# Patient Record
Sex: Female | Born: 1970 | Race: Black or African American | Hispanic: No | Marital: Single | Smoking: Current some day smoker
Health system: Southern US, Community
[De-identification: ages and names within clinical notes are randomized; demographics above are authoritative.]

## PROBLEM LIST (undated history)

## (undated) DIAGNOSIS — E119 Type 2 diabetes mellitus without complications: Secondary | ICD-10-CM

## (undated) DIAGNOSIS — T7840XA Allergy, unspecified, initial encounter: Secondary | ICD-10-CM

## (undated) DIAGNOSIS — I10 Essential (primary) hypertension: Secondary | ICD-10-CM

## (undated) DIAGNOSIS — B019 Varicella without complication: Secondary | ICD-10-CM

## (undated) HISTORY — DX: Allergy, unspecified, initial encounter: T78.40XA

## (undated) HISTORY — PX: TUBAL LIGATION: SHX77

## (undated) HISTORY — DX: Type 2 diabetes mellitus without complications: E11.9

## (undated) HISTORY — DX: Essential (primary) hypertension: I10

## (undated) HISTORY — DX: Varicella without complication: B01.9

---

## 2017-01-11 ENCOUNTER — Encounter: Payer: Self-pay | Admitting: *Deleted

## 2017-01-11 ENCOUNTER — Ambulatory Visit
Admission: EM | Admit: 2017-01-11 | Discharge: 2017-01-11 | Disposition: A | Discharge status: Home / Self Care | Payer: Self-pay | Attending: Family Medicine | Admitting: Family Medicine

## 2017-01-11 DIAGNOSIS — K122 Cellulitis and abscess of mouth: Secondary | ICD-10-CM

## 2017-01-11 DIAGNOSIS — K1370 Unspecified lesions of oral mucosa: Secondary | ICD-10-CM

## 2017-01-11 MED ORDER — AMOXICILLIN-POT CLAVULANATE 875-125 MG PO TABS: 1.0000 | ORAL_TABLET | Freq: Two times a day (BID) | ORAL | 0 refills | Status: DC

## 2017-01-11 MED ORDER — MELOXICAM 15 MG PO TABS: 15.0000 mg | ORAL_TABLET | Freq: Every day | ORAL | 0 refills | Status: DC

## 2017-01-11 NOTE — ED Provider Notes (Signed)
[facility]    CSN: [id number] Arrival date & time: 01/11/17  1231     History   Chief Complaint Chief Complaint  Patient presents with  . Oral Swelling    HPI Stacy Curry is a 46 y.o. female.   Patient is a 46 year old [place] female who woke this morning with swelling of the right upper mouth underneath the right nostril and right eye. She hasn't an appointment for next week she's had trouble with her teeth and oral mucosa. She smokes she's not allergic to medication she's had a tubal ligation but no other surgeries no other medical problems no pertinent family medical history relevant to today's visit.   The history is provided by the patient. No language interpreter was used.    History reviewed. No pertinent past medical history.  There are no active problems to display for this patient.   Past Surgical History:  Procedure Laterality Date  . TUBAL LIGATION      OB History    No data available       Home Medications    Prior to Admission medications   Medication Sig Start Date End Date Taking? Authorizing Provider  amoxicillin-clavulanate (AUGMENTIN) 875-125 MG tablet Take 1 tablet by mouth 2 (two) times daily. 01/11/17   Hassan Rowan, MD  meloxicam (MOBIC) 15 MG tablet Take 1 tablet (15 mg total) by mouth daily. 01/11/17   Hassan Rowan, MD    Family History History reviewed. No pertinent family history.  Social History Social History  Substance Use Topics  . Smoking status: Current Every Day Smoker  . Smokeless tobacco: Never Used  . Alcohol use No     Allergies   Patient has no known allergies.   Review of Systems Review of Systems  HENT: Positive for facial swelling and sinus pain. Negative for sore throat.   All other systems reviewed and are negative.    Physical Exam Triage Vital Signs ED Triage Vitals  Enc Vitals Group     BP 01/11/17 1247 (!) 144/86     Pulse Rate 01/11/17 1247 78     Resp 01/11/17 1247 16       Temp 01/11/17 1247 98.6 F (37 C)     Temp Source 01/11/17 1247 Oral     SpO2 01/11/17 1247 100 %     Weight 01/11/17 1249 168 lb (76.2 kg)     Height 01/11/17 1249 5\' 4"  (1.626 m)     Head Circumference --      Peak Flow --      Pain Score 01/11/17 1249 10     Pain Loc --      Pain Edu? --      Excl. in GC? --    No data found.   Updated Vital Signs BP (!) 144/86 (BP Location: Left Arm)   Pulse 78   Temp 98.6 F (37 C) (Oral)   Resp 16   Ht 5\' 4"  (1.626 m)   Wt 168 lb (76.2 kg)   LMP 01/11/2017   SpO2 100%   BMI 28.84 kg/m   Visual Acuity Right Eye Distance:   Left Eye Distance:   Bilateral Distance:    Right Eye Near:   Left Eye Near:    Bilateral Near:     Physical Exam  Constitutional: She is oriented to person, place, and time. She appears well-developed and well-nourished.  HENT:  Head: Normocephalic and atraumatic.  Right Ear: External ear normal. A foreign body  is present.  Left Ear: Hearing, tympanic membrane, external ear and ear canal normal.  Nose: Mucosal edema present. Right sinus exhibits maxillary sinus tenderness. Right sinus exhibits no frontal sinus tenderness. Left sinus exhibits no maxillary sinus tenderness and no frontal sinus tenderness.    Mouth/Throat: Oropharynx is clear and moist.  Swelling in the right upper mouth and cheek consistent with or abscess.  Eyes: Pupils are equal, round, and reactive to light. EOM are normal.  Neck: Normal range of motion. Neck supple. No tracheal deviation present.  Pulmonary/Chest: Effort normal.  Musculoskeletal: Normal range of motion.  Neurological: She is alert and oriented to person, place, and time.  Skin: Skin is warm.  Psychiatric: She has a normal mood and affect.  Vitals reviewed.    UC Treatments / Results  Labs (all labs ordered are listed, but only abnormal results are displayed) Labs Reviewed - No data to display  EKG  EKG Interpretation None       Radiology No  results found.  Procedures Procedures (including critical care time)  Medications Ordered in UC Medications - No data to display   Initial Impression / Assessment and Plan / UC Course  I have reviewed the triage vital signs and the nursing notes.  Pertinent labs & imaging results that were available during my care of the patient were reviewed by me and considered in my medical decision making (see chart for details).   offer shot of Toradol she declined offer narcotics she also declined we'll place on Mobic 15 mg 1 tablet day Augmentin 875 one tablet twice a day work note given for today and tomorrow follow-up with Maurine Ministerennis next week as scheduled.  Final Clinical Impressions(s) / UC Diagnoses   Final diagnoses:  Oral soft tissue disease  Cellulitis and abscess of oral soft tissues    New Prescriptions Discharge Medication List as of 01/11/2017  2:19 PM    START taking these medications   Details  amoxicillin-clavulanate (AUGMENTIN) 875-125 MG tablet Take 1 tablet by mouth 2 (two) times daily., Starting Wed 01/11/2017, Normal    meloxicam (MOBIC) 15 MG tablet Take 1 tablet (15 mg total) by mouth daily., Starting Wed 01/11/2017, Normal       Note: This dictation was prepared with Dragon dictation along with smaller phrase technology. Any transcriptional errors that result from this process are unintentional.  Controlled Substance Prescriptions [place] Controlled Substance Registry consulted? Not Applicable   Hassan RowanWade, Saw Mend[place], MD 01/11/17 [phone]

## 2017-01-11 NOTE — ED Triage Notes (Signed)
PAtient started having symptom of right side facial swelling and pain. Patient does report that she has an appointment with her dentist for extensive dental repair.

## 2017-08-07 ENCOUNTER — Other Ambulatory Visit: Payer: Self-pay

## 2017-08-07 ENCOUNTER — Telehealth: Payer: Self-pay | Admitting: Family Medicine

## 2017-08-07 ENCOUNTER — Ambulatory Visit
Admission: EM | Admit: 2017-08-07 | Discharge: 2017-08-07 | Disposition: A | Discharge status: Home / Self Care | Payer: Self-pay | Attending: Family Medicine | Admitting: Family Medicine

## 2017-08-07 ENCOUNTER — Encounter: Payer: Self-pay | Admitting: Emergency Medicine

## 2017-08-07 DIAGNOSIS — R059 Cough, unspecified: Secondary | ICD-10-CM

## 2017-08-07 DIAGNOSIS — R05 Cough: Secondary | ICD-10-CM

## 2017-08-07 MED ORDER — HYDROCOD POLST-CPM POLST ER 10-8 MG/5ML PO SUER: 5.0000 mL | Freq: Two times a day (BID) | ORAL | 0 refills | Status: DC | PRN

## 2017-08-07 NOTE — ED Triage Notes (Signed)
Patient c/o cough and chest congestion that started last night.  Patient denies fevers.

## 2017-08-07 NOTE — Telephone Encounter (Signed)
Rx sent to different pharmacy.

## 2017-08-07 NOTE — Discharge Instructions (Signed)
Rest, fluids. ° °Cough medication as directed. ° °Take care ° °Dr. Amad Mau  °

## 2017-08-07 NOTE — ED Provider Notes (Signed)
[facility]  CSN: [id number] Arrival date & time: 08/07/17  1010  History   Chief Complaint Chief Complaint  Patient presents with  . Cough   HPI  47 year old female presents with cough.  Patient states that she has had severe cough as of last night.  Nonproductive.  Dry.  No fever.  No shortness of breath.  She states that she is now experience and headache from all the coughing.  She is taken Robitussin without improvement.  No known exacerbating factors.  She is a smoker.  No other associated symptoms.  No other complaints at this time.  Social History Social History   Tobacco Use  . Smoking status: Current Every Day Smoker  . Smokeless tobacco: Never Used  Substance Use Topics  . Alcohol use: No  . Drug use: No   Allergies   Patient has no known allergies.   Review of Systems Review of Systems  Respiratory: Positive for cough.    Physical Exam Triage Vital Signs ED Triage Vitals  Enc Vitals Group     BP 08/07/17 1058 140/90     Pulse Rate 08/07/17 1058 86     Resp 08/07/17 1058 16     Temp 08/07/17 1058 98.1 F (36.7 C)     Temp Source 08/07/17 1058 Oral     SpO2 08/07/17 1058 100 %     Weight 08/07/17 1055 172 lb (78 kg)     Height 08/07/17 1055 5\' 4"  (1.626 m)     Head Circumference --      Peak Flow --      Pain Score 08/07/17 1055 0     Pain Loc --      Pain Edu? --      Excl. in GC? --    Updated Vital Signs BP 140/90 (BP Location: Right Arm)   Pulse 86   Temp 98.1 F (36.7 C) (Oral)   Resp 16   Ht 5\' 4"  (1.626 m)   Wt 172 lb (78 kg)   LMP 07/17/2017 (Approximate)   SpO2 100%   BMI 29.52 kg/m   Physical Exam  Constitutional: She is oriented to person, place, and time. She appears well-developed. No distress.  HENT:  Head: Normocephalic and atraumatic.  Mouth/Throat: Oropharynx is clear and moist.  Cardiovascular: Normal rate and regular rhythm.  Pulmonary/Chest: Effort normal and breath sounds normal. She has no wheezes.  She has no rales.  Neurological: She is alert and oriented to person, place, and time.  Psychiatric: She has a normal mood and affect. Her behavior is normal.  Nursing note and vitals reviewed.  UC Treatments / Results  Labs (all labs ordered are listed, but only abnormal results are displayed) Labs Reviewed - No data to display  EKG None Radiology No results found.  Procedures Procedures (including critical care time)  Medications Ordered in UC Medications - No data to display   Initial Impression / Assessment and Plan / UC Course  I have reviewed the triage vital signs and the nursing notes.  Pertinent labs & imaging results that were available during my care of the patient were reviewed by me and considered in my medical decision making (see chart for details).     47 year old female presents with cough.  Treating with Tussionex.  Final Clinical Impressions(s) / UC Diagnoses   Final diagnoses:  Cough    ED Discharge Orders        Ordered    chlorpheniramine-HYDROcodone (TUSSIONEX PENNKINETIC ER) 10-8  MG/5ML SUER  Every 12 hours PRN     08/07/17 1139     Controlled Substance Prescriptions [place] Controlled Substance Registry consulted? Not Applicable   Tommie Sams, DO 08/07/17 1155

## 2017-10-12 ENCOUNTER — Emergency Department
Admission: EM | Admit: 2017-10-12 | Discharge: 2017-10-12 | Disposition: A | Discharge status: Home / Self Care | Payer: Self-pay | Attending: Emergency Medicine | Admitting: Emergency Medicine

## 2017-10-12 ENCOUNTER — Encounter: Payer: Self-pay | Admitting: Emergency Medicine

## 2017-10-12 ENCOUNTER — Other Ambulatory Visit: Payer: Self-pay

## 2017-10-12 DIAGNOSIS — R030 Elevated blood-pressure reading, without diagnosis of hypertension: Secondary | ICD-10-CM | POA: Insufficient documentation

## 2017-10-12 DIAGNOSIS — F17200 Nicotine dependence, unspecified, uncomplicated: Secondary | ICD-10-CM | POA: Insufficient documentation

## 2017-10-12 DIAGNOSIS — J0100 Acute maxillary sinusitis, unspecified: Secondary | ICD-10-CM | POA: Insufficient documentation

## 2017-10-12 MED ORDER — FEXOFENADINE-PSEUDOEPHED ER 60-120 MG PO TB12: 1.0000 | ORAL_TABLET | Freq: Two times a day (BID) | ORAL | 0 refills | Status: DC

## 2017-10-12 MED ORDER — AMOXICILLIN 500 MG PO CAPS: 500.0000 mg | ORAL_CAPSULE | Freq: Three times a day (TID) | ORAL | 0 refills | Status: DC

## 2017-10-12 MED ORDER — NAPROXEN 500 MG PO TABS: 500.0000 mg | ORAL_TABLET | Freq: Two times a day (BID) | ORAL | Status: DC

## 2017-10-12 NOTE — ED Triage Notes (Addendum)
Presents with a 1 week hx of sinus pressure and headache   Pt has swelling to right side of face on arrival

## 2017-10-12 NOTE — ED Provider Notes (Addendum)
[facility] Emergency Department Provider Note   ____________________________________________   First MD Initiated Contact with Patient 10/12/17 1157     (approximate)  I have reviewed the triage vital signs and the nursing notes.   HISTORY  Chief Complaint Facial Pain    HPI Stacy Curry is a 47 y.o. female patient presents for 1 week of frontal headache and sinus pressure.  Patient state pain also radiates to upper teeth.  Patient denies fever or chills associated with this complaint.  It was noted that patient blood pressure elevated but no history of hypertension.  Patient denies vision change or vertigo.  Patient denies fever/chills associated with complaint.  Patient denies dental issues.  History reviewed. No pertinent past medical history.  There are no active problems to display for this patient.   Past Surgical History:  Procedure Laterality Date  . TUBAL LIGATION      Prior to Admission medications   Medication Sig Start Date End Date Taking? Authorizing Provider  amoxicillin (AMOXIL) 500 MG capsule Take 1 capsule (500 mg total) by mouth 3 (three) times daily. 10/12/17   Joni Reining, PA-C  chlorpheniramine-HYDROcodone (TUSSIONEX PENNKINETIC ER) 10-8 MG/5ML SUER Take 5 mLs by mouth every 12 (twelve) hours as needed. 08/07/17   Tommie Sams, DO  chlorpheniramine-HYDROcodone (TUSSIONEX PENNKINETIC ER) 10-8 MG/5ML SUER Take 5 mLs by mouth every 12 (twelve) hours as needed. 08/07/17   Tommie Sams, DO  fexofenadine-pseudoephedrine (ALLEGRA-D) 60-120 MG 12 hr tablet Take 1 tablet by mouth 2 (two) times daily. 10/12/17   Joni Reining, PA-C  naproxen (NAPROSYN) 500 MG tablet Take 1 tablet (500 mg total) by mouth 2 (two) times daily with a meal. 10/12/17   Joni Reining, PA-C    Allergies Patient has no known allergies.  No family history on file.  Social History Social History   Tobacco Use  . Smoking status: Current Every Day  Smoker  . Smokeless tobacco: Never Used  Substance Use Topics  . Alcohol use: No  . Drug use: No    Review of Systems Constitutional: No fever/chills Eyes: No visual changes. ENT: Sinus congestion and facial pain.  Cardiovascular: Denies chest pain. Respiratory: Denies shortness of breath. Gastrointestinal: No abdominal pain.  No nausea, no vomiting.  No diarrhea.  No constipation. Genitourinary: Negative for dysuria. Musculoskeletal: Negative for back pain. Skin: Negative for rash. Neurological: Positive for frontal headaches, but denies focal weakness or numbness.   ____________________________________________   PHYSICAL EXAM:  VITAL SIGNS: ED Triage Vitals [10/12/17 1154]  Enc Vitals Group     BP      Pulse      Resp      Temp      Temp src      SpO2      Weight 172 lb (78 kg)     Height  (1.626 m)     Head Circumference      Peak Flow      Pain Score 4     Pain Loc      Pain Edu?      Excl. in GC?    Constitutional: Alert and oriented. Well appearing and in no acute distress. Eyes: Conjunctivae are normal. PERRL. EOMI. Head: Atraumatic. Nose: Bilateral edematous nasal turbinates with thick rhinorrhea.  Bilateral maxillary sinus guarding. Mouth/Throat: Mucous membranes are moist.  Oropharynx non-erythematous. Neck: No stridor.  Hematological/Lymphatic/Immunilogical: No cervical lymphadenopathy. Cardiovascular: Normal rate, regular rhythm. Grossly normal heart sounds.  Good peripheral circulation.  Elevated blood pressure. Respiratory: Normal respiratory effort.  No retractions. Lungs CTAB. Neurologic:  Normal speech and language. No gross focal neurologic deficits are appreciated. No gait instability. Skin:  Skin is warm, dry and intact. No rash noted. Psychiatric: Mood and affect are normal. Speech and behavior are normal.  ____________________________________________   LABS (all labs ordered are listed, but only abnormal results are  displayed)  Labs Reviewed - No data to display ____________________________________________  EKG   ____________________________________________  RADIOLOGY  ED MD interpretation:    Official radiology report(s): No results found.  ____________________________________________   PROCEDURES  Procedure(s) performed: None  Procedures  Critical Care performed: No  ____________________________________________   INITIAL IMPRESSION / ASSESSMENT AND PLAN / ED COURSE  As part of my medical decision making, I reviewed the following data within the electronic MEDICAL RECORD NUMBER    Frontal headache and sinus pressure secondary sinusitis.  Patient blood pressure elevated and advised to have it followed up by PCP.  Take medication as directed.  Patient given a work note for today.      ____________________________________________   FINAL CLINICAL IMPRESSION(S) / ED DIAGNOSES  Final diagnoses:  Subacute maxillary sinusitis     ED Discharge Orders        Ordered    amoxicillin (AMOXIL) 500 MG capsule  3 times daily     10/12/17 1204    fexofenadine-pseudoephedrine (ALLEGRA-D) 60-120 MG 12 hr tablet  2 times daily     10/12/17 1204    naproxen (NAPROSYN) 500 MG tablet  2 times daily with meals     10/12/17 1204       Note:  This document was prepared using Dragon voice recognition software and may include unintentional dictation errors.    Joni Reining, PA-C 10/12/17 1215    Joni Reining, PA-C 10/12/17 1217    Sharman Cheek, MD 10/12/17 [phone]

## 2017-10-12 NOTE — Discharge Instructions (Signed)
Advised to follow-up with PCP for 3 days of blood pressure readings.

## 2017-11-14 ENCOUNTER — Emergency Department: Payer: Self-pay

## 2017-11-14 ENCOUNTER — Other Ambulatory Visit: Payer: Self-pay

## 2017-11-14 ENCOUNTER — Emergency Department
Admission: EM | Admit: 2017-11-14 | Discharge: 2017-11-14 | Disposition: A | Discharge status: Home / Self Care | Payer: Self-pay | Attending: Student in an Organized Health Care Education/Training Program | Admitting: Student in an Organized Health Care Education/Training Program

## 2017-11-14 ENCOUNTER — Encounter: Payer: Self-pay | Admitting: Emergency Medicine

## 2017-11-14 DIAGNOSIS — R079 Chest pain, unspecified: Secondary | ICD-10-CM | POA: Insufficient documentation

## 2017-11-14 DIAGNOSIS — F17200 Nicotine dependence, unspecified, uncomplicated: Secondary | ICD-10-CM | POA: Insufficient documentation

## 2017-11-14 DIAGNOSIS — R0789 Other chest pain: Secondary | ICD-10-CM | POA: Insufficient documentation

## 2017-11-14 LAB — CBC
HEMATOCRIT: 40.6 % (ref 35.0–47.0)
Hemoglobin: 14.5 g/dL (ref 12.0–16.0)
MCH: 32.5 pg (ref 26.0–34.0)
MCHC: 35.7 g/dL (ref 32.0–36.0)
MCV: 91.2 fL (ref 80.0–100.0)
PLATELETS: 283 10*3/uL (ref 150–440)
RBC: 4.46 MIL/uL (ref 3.80–5.20)
RDW: 14.2 % (ref 11.5–14.5)
WBC: 8.8 10*3/uL (ref 3.6–11.0)

## 2017-11-14 LAB — BASIC METABOLIC PANEL
Anion gap: 5 (ref 5–15)
BUN: 11 mg/dL (ref 6–20)
CHLORIDE: 107 mmol/L (ref 98–111)
CO2: 25 mmol/L (ref 22–32)
CREATININE: 0.69 mg/dL (ref 0.44–1.00)
Calcium: 9.4 mg/dL (ref 8.9–10.3)
GFR calc Af Amer: >60 mL/min (ref >60)
GLUCOSE: 130 mg/dL — AB (ref 70–99)
POTASSIUM: 4.2 mmol/L (ref 3.5–5.1)
Sodium: 137 mmol/L (ref 135–145)

## 2017-11-14 LAB — TROPONIN I
Troponin I: <0.03 ng/mL (ref <0.03)
Troponin I: <0.03 ng/mL (ref <0.03)

## 2017-11-14 LAB — FIBRIN DERIVATIVES D-DIMER (ARMC ONLY): Fibrin derivatives D-dimer (ARMC): 335.15 ng/mL (FEU) (ref 0.00–499.00)

## 2017-11-14 LAB — POCT PREGNANCY, URINE: Preg Test, Ur: NEGATIVE

## 2017-11-14 MED ORDER — TRAMADOL HCL 50 MG PO TABS: 50.0000 mg | ORAL_TABLET | Freq: Four times a day (QID) | ORAL | 0 refills | Status: AC | PRN

## 2017-11-14 MED ORDER — CYCLOBENZAPRINE HCL 5 MG PO TABS: 5.0000 mg | ORAL_TABLET | Freq: Three times a day (TID) | ORAL | 0 refills | Status: DC | PRN

## 2017-11-14 MED ORDER — HYDROCODONE-ACETAMINOPHEN 5-325 MG PO TABS
1.0000 | ORAL_TABLET | Freq: Once | ORAL | Status: AC
  Administered 2017-11-14: 1 via ORAL
  Filled 2017-11-14: qty 1

## 2017-11-14 NOTE — ED Triage Notes (Addendum)
Pt c/o mid back pain that started with sudden onset at approx 1530 today. Pt states when she was going to sit down pain started, pt states pain worse with movement. Pt states pain just off to the L side of her spine with a downward radiation at times. Pt now also stating pain under L breast, that started at 1530. Pt states pain is squeezing in nature. States pian radiates to her back.

## 2017-11-14 NOTE — ED Provider Notes (Signed)
[facility] Emergency Department Provider Note    First MD Initiated Contact with Patient 11/14/17 1724     (approximate)  I have reviewed the triage vital signs and the nursing notes.   HISTORY  Chief Complaint Back Pain and Chest Pain    HPI Stacy Curry is a 47 y.o. female since the ER with chief complaint of mild to moderate left-sided chest pain that does hurt worse when taking deep inspiration.  Does state the pain is worse with movement.  Denies any trauma and states that occurred while she was going to sit down.  Pain wraps around all the way on her left breast and describes it as a squeezing pain.  Does not report any tearing or ripping pain through to her back.  Is not on anticoagulation.  No recent surgeries.  Denies any history of cardiac disease but does smoke.  No fevers and no cough.    History reviewed. No pertinent past medical history. History reviewed. No pertinent family history. Past Surgical History:  Procedure Laterality Date  . TUBAL LIGATION     There are no active problems to display for this patient.     Prior to Admission medications   Medication Sig Start Date End Date Taking? Authorizing Provider  amoxicillin (AMOXIL) 500 MG capsule Take 1 capsule (500 mg total) by mouth 3 (three) times daily. 10/12/17   Joni Reining, PA-C  chlorpheniramine-HYDROcodone (TUSSIONEX PENNKINETIC ER) 10-8 MG/5ML SUER Take 5 mLs by mouth every 12 (twelve) hours as needed. 08/07/17   Tommie Sams, DO  chlorpheniramine-HYDROcodone (TUSSIONEX PENNKINETIC ER) 10-8 MG/5ML SUER Take 5 mLs by mouth every 12 (twelve) hours as needed. 08/07/17   Tommie Sams, DO  cyclobenzaprine (FLEXERIL) 5 MG tablet Take 1 tablet (5 mg total) by mouth 3 (three) times daily as needed for muscle spasms. 11/14/17   Willy Eddy, MD  fexofenadine-pseudoephedrine (ALLEGRA-D) 60-120 MG 12 hr tablet Take 1 tablet by mouth 2 (two) times daily. 10/12/17   Joni Reining,  PA-C  naproxen (NAPROSYN) 500 MG tablet Take 1 tablet (500 mg total) by mouth 2 (two) times daily with a meal. 10/12/17   Joni Reining, PA-C  traMADol (ULTRAM) 50 MG tablet Take 1 tablet (50 mg total) by mouth every 6 (six) hours as needed. 11/14/17 11/14/18  Willy Eddy, MD    Allergies Patient has no known allergies.    Social History Social History   Tobacco Use  . Smoking status: Current Every Day Smoker  . Smokeless tobacco: Never Used  Substance Use Topics  . Alcohol use: No  . Drug use: No    Review of Systems Patient denies headaches, rhinorrhea, blurry vision, numbness, shortness of breath, chest pain, edema, cough, abdominal pain, nausea, vomiting, diarrhea, dysuria, fevers, rashes or hallucinations unless otherwise stated above in HPI. ____________________________________________   PHYSICAL EXAM:  VITAL SIGNS: Vitals:   11/14/17 1650  BP: 113/75  Pulse: (!) 101  Resp: 20  Temp: 98.7 F (37.1 C)  SpO2: 99%    Constitutional: Alert and oriented.  Eyes: Conjunctivae are normal.  Head: Atraumatic. Nose: No congestion/rhinnorhea. Mouth/Throat: Mucous membranes are moist.   Neck: No stridor. Painless ROM.  Cardiovascular: Normal rate, regular rhythm. Grossly normal heart sounds.  Good peripheral circulation. Respiratory: Normal respiratory effort.  No retractions. Lungs CTAB. Gastrointestinal: Soft and nontender. No distention. No abdominal bruits. No CVA tenderness. Genitourinary: deferred Musculoskeletal: No lower extremity tenderness nor edema.  No joint effusions. Neurologic:  Normal speech and language. No gross focal neurologic deficits are appreciated. No facial droop Skin:  Skin is warm, dry and intact. No rash noted. Psychiatric: Mood and affect are normal. Speech and behavior are normal.  ____________________________________________   LABS (all labs ordered are listed, but only abnormal results are displayed)  Results for orders placed or  performed during the hospital encounter of 11/14/17 (from the past 24 hour(s))  Basic metabolic panel     Status: Abnormal   Collection Time: 11/14/17  5:06 PM  Result Value Ref Range   Sodium 137 135 - 145 mmol/L   Potassium 4.2 3.5 - 5.1 mmol/L   Chloride 107 98 - 111 mmol/L   CO2 25 22 - 32 mmol/L   Glucose, Bld 130 (H) 70 - 99 mg/dL   BUN 11 6 - 20 mg/dL   Creatinine, Ser 2.950.69 0.44 - 1.00 mg/dL   Calcium 9.4 8.9 - 18.810.3 mg/dL   GFR calc non Af Amer >60 >60 mL/min   GFR calc Af Amer >60 >60 mL/min   Anion gap 5 5 - 15  CBC     Status: None   Collection Time: 11/14/17  5:06 PM  Result Value Ref Range   WBC 8.8 3.6 - 11.0 K/uL   RBC 4.46 3.80 - 5.20 MIL/uL   Hemoglobin 14.5 12.0 - 16.0 g/dL   HCT 41.640.6 60.635.0 - 30.147.0 %   MCV 91.2 80.0 - 100.0 fL   MCH 32.5 26.0 - 34.0 pg   MCHC 35.7 32.0 - 36.0 g/dL   RDW 60.114.2 09.311.5 - 23.514.5 %   Platelets 283 150 - 440 K/uL  Troponin I     Status: None   Collection Time: 11/14/17  5:06 PM  Result Value Ref Range   Troponin I <0.03 <0.03 ng/mL  Fibrin derivatives D-Dimer ([facility] only)     Status: None   Collection Time: 11/14/17  5:31 PM  Result Value Ref Range   Fibrin derivatives D-dimer ([facility]) 335.15 0.00 - 499.00 ng/mL (FEU)  Pregnancy, urine POC     Status: None   Collection Time: 11/14/17  6:21 PM  Result Value Ref Range   Preg Test, Ur NEGATIVE NEGATIVE  Troponin I     Status: None   Collection Time: 11/14/17  8:09 PM  Result Value Ref Range   Troponin I <0.03 <0.03 ng/mL   ____________________________________________  EKG My review and personal interpretation at Time: 17:02   Indication: chest pain  Rate: 95  Rhythm: sinus Axis: normal Other: normal intervals, nonspecific st abn, no stemi ____________________________________________  RADIOLOGY  I personally reviewed all radiographic images ordered to evaluate for the above acute complaints and reviewed radiology reports and findings.  These findings were personally discussed with  the patient.  Please see medical record for radiology report.  ____________________________________________   PROCEDURES  Procedure(s) performed:  Procedures    Critical Care performed: no ____________________________________________   INITIAL IMPRESSION / ASSESSMENT AND PLAN / ED COURSE  Pertinent labs & imaging results that were available during my care of the patient were reviewed by me and considered in my medical decision making (see chart for details).   DDX: ACS, pericarditis, esophagitis, boerhaaves, pe, dissection, pna, bronchitis, costochondritis   Loletta SpecterJenate [place] is a 47 y.o. who presents to the ED with symptoms as described above.  Patient nontoxic with normotensive and afebrile.  Her abdominal exam is soft and benign.  Denies any symptoms to suggest pyelonephritis or kidney stone.  EKG shows no  evidence of acute ischemia or dysrhythmia.  On examination seems most clinically consistent with muscular skeletal strain but based on her age smoking history and risk factors will further evaluate with serial enzymes.  Patient low risk by Wells criteria and therefore d-dimer was sent to further risk stratify for PE.  D-dimer negative.  Will provide oral pain medication and reassess.  Clinical Course as of Nov 14 2053  Tue Nov 14, 2017  1918 RDW: 14.2 [PR]  2045 Repeat troponin is negative.  Seems most clinically consistent with muscular skeletal pain.  Not clinically consistent with ACS.  Patient remains hemodynamically stable.   [PR]    Clinical Course User Index [PR] Willy Eddy, MD     As part of my medical decision making, I reviewed the following data within the electronic MEDICAL RECORD NUMBER Nursing notes reviewed and incorporated, Labs reviewed, notes from prior ED visits.   ____________________________________________   FINAL CLINICAL IMPRESSION(S) / ED DIAGNOSES  Final diagnoses:  Chest wall pain      NEW MEDICATIONS STARTED DURING THIS VISIT:  New  Prescriptions   CYCLOBENZAPRINE (FLEXERIL) 5 MG TABLET    Take 1 tablet (5 mg total) by mouth 3 (three) times daily as needed for muscle spasms.   TRAMADOL (ULTRAM) 50 MG TABLET    Take 1 tablet (50 mg total) by mouth every 6 (six) hours as needed.     Note:  This document was prepared using Dragon voice recognition software and may include unintentional dictation errors.    Willy Eddy, MD 11/14/17 2055

## 2018-12-16 ENCOUNTER — Other Ambulatory Visit: Payer: Self-pay

## 2018-12-16 ENCOUNTER — Encounter: Payer: Self-pay | Admitting: Emergency Medicine

## 2018-12-16 ENCOUNTER — Ambulatory Visit
Admission: EM | Admit: 2018-12-16 | Discharge: 2018-12-16 | Disposition: A | Discharge status: Home / Self Care | Payer: Self-pay | Attending: Emergency Medicine | Admitting: Emergency Medicine

## 2018-12-16 DIAGNOSIS — K047 Periapical abscess without sinus: Secondary | ICD-10-CM

## 2018-12-16 MED ORDER — HYDROCODONE-ACETAMINOPHEN 5-325 MG PO TABS: 1.0000 | ORAL_TABLET | Freq: Four times a day (QID) | ORAL | 0 refills | Status: DC | PRN

## 2018-12-16 MED ORDER — AMOXICILLIN-POT CLAVULANATE 875-125 MG PO TABS: 1.0000 | ORAL_TABLET | Freq: Two times a day (BID) | ORAL | 0 refills | Status: DC

## 2018-12-16 NOTE — Discharge Instructions (Addendum)
May use ice 15 to 20 minutes out of every 2 hours over the area of discomfort.  Use ibuprofen 400 mg combined with Tylenol 500 mg every 6 hours as necessary for pain.  Use hydrocodone on a as necessary basis for severe pain.  Be sure the total of the Tylenol that you use does not equal more than 4000 mg.  Follow-up with your dentist tomorrow at [facility].

## 2018-12-16 NOTE — ED Triage Notes (Signed)
Patient states she had a tooth pulled on Tuesday. Patient now c/o facial swelling and pain that started yesterday. Patient states she took 1 Tylenol with Codeine on Tuesday and had no reaction but she took again last night and woke up with the swelling in her face.

## 2018-12-16 NOTE — ED Provider Notes (Signed)
[facility]    CSN: [id number] Arrival date & time: 12/16/18  0850      History   Chief Complaint Chief Complaint  Patient presents with  . Facial Swelling  . Facial Pain    HPI Khira SwazilandJordan is a 48 y.o. female.   Presents with a chief complaint of left upper gum pain and swelling  HPI  48 year old female presents with left upper tooth pain over her canine.  She had a tooth removed on the right upper gum on Tuesday 5 days prior to this visit.  Is been using ibuprofen 600 mg combined with Tylenol 500 mg to help control her pain.  Last night she took a Tylenol with codeine prescribed by her dentist.  This was the second dose that she had taken throughout the entire.  From the extraction.  She has very poor dentition and is in the process of having all of the of her teeth extracted.  She awoke this morning with the terrible pain opposite where her tooth was pulled and mild puffiness of her face.  She has significant pain to touch over the left upper canine.  All the rest of her teeth are in extremely poor condition.  She has had no difficulty swallowing she denies any fever.  Her blood pressure is elevated today most likely from the pain as she does not have a history of hypertension.        History reviewed. No pertinent past medical history.  There are no active problems to display for this patient.   Past Surgical History:  Procedure Laterality Date  . TUBAL LIGATION      OB History   No obstetric history on file.      Home Medications    Prior to Admission medications   Medication Sig Start Date End Date Taking? Authorizing Provider  amoxicillin-clavulanate (AUGMENTIN) 875-125 MG tablet Take 1 tablet by mouth every 12 (twelve) hours. 12/16/18   Lutricia Feiloemer, Favor Kreh P, PA-C  HYDROcodone-acetaminophen (NORCO/VICODIN) 5-325 MG tablet Take 1-2 tablets by mouth every 6 (six) hours as needed. 12/16/18   Lutricia Feiloemer, Shakeitha Umbaugh P, PA-C    Family History History reviewed. No  pertinent family history.  Social History Social History   Tobacco Use  . Smoking status: Current Every Day Smoker  . Smokeless tobacco: Never Used  Substance Use Topics  . Alcohol use: No  . Drug use: No     Allergies   Patient has no known allergies.   Review of Systems Review of Systems  Constitutional: Positive for activity change and appetite change. Negative for chills, diaphoresis, fatigue and fever.  HENT: Positive for dental problem.   All other systems reviewed and are negative.    Physical Exam Triage Vital Signs ED Triage Vitals  Enc Vitals Group     BP 12/16/18 0909 (!) 163/101     Pulse Rate 12/16/18 0909 94     Resp 12/16/18 0909 18     Temp 12/16/18 0909 98.8 F (37.1 C)     Temp Source 12/16/18 0909 Oral     SpO2 12/16/18 0909 97 %     Weight 12/16/18 0907 172 lb (78 kg)     Height 12/16/18 0907 5\' 4"  (1.626 m)     Head Circumference --      Peak Flow --      Pain Score 12/16/18 0907 10     Pain Loc --      Pain Edu? --  Excl. in GC? --    No data found.  Updated Vital Signs BP (!) 163/101 (BP Location: Right Arm)   Pulse 94   Temp 98.8 F (37.1 C) (Oral)   Resp 18   Ht 5\' 4"  (1.626 m)   Wt 172 lb (78 kg)   SpO2 97%   BMI 29.52 kg/m   Visual Acuity Right Eye Distance:   Left Eye Distance:   Bilateral Distance:    Right Eye Near:   Left Eye Near:    Bilateral Near:     Physical Exam Vitals signs and nursing note reviewed.  Constitutional:      General: She is not in acute distress.    Appearance: Normal appearance. She is not ill-appearing.  HENT:     Head: Normocephalic and atraumatic.     Right Ear: Tympanic membrane, ear canal and external ear normal.     Left Ear: Tympanic membrane, ear canal and external ear normal.     Nose: Nose normal.     Mouth/Throat:     Mouth: Mucous membranes are moist.     Dentition: Abnormal dentition. Dental tenderness, gingival swelling and dental caries present.     Pharynx:  Oropharynx is clear. Uvula midline. No pharyngeal swelling, oropharyngeal exudate, posterior oropharyngeal erythema or uvula swelling.      Comments: Examination shows the dentition to be in extremely poor condition.  All her upper teeth are laden with caries and most are fractured to the gumline.  Her left upper canine is extremely tender to palpation and there is a mild fluctuance present.  No frank pus is appreciated. Eyes:     Extraocular Movements: Extraocular movements intact.     Pupils: Pupils are equal, round, and reactive to light.  Neck:     Musculoskeletal: Normal range of motion and neck supple. No neck rigidity or muscular tenderness.  Musculoskeletal: Normal range of motion.  Lymphadenopathy:     Cervical: No cervical adenopathy.  Skin:    General: Skin is warm and dry.  Neurological:     General: No focal deficit present.     Mental Status: She is alert and oriented to person, place, and time.  Psychiatric:        Mood and Affect: Mood normal.        Behavior: Behavior normal.        Thought Content: Thought content normal.        Judgment: Judgment normal.      UC Treatments / Results  Labs (all labs ordered are listed, but only abnormal results are displayed) Labs Reviewed - No data to display  EKG   Radiology No results found.  Procedures Procedures (including critical care time)  Medications Ordered in UC Medications - No data to display  Initial Impression / Assessment and Plan / UC Course  I have reviewed the triage vital signs and the nursing notes.  Pertinent labs & imaging results that were available during my care of the patient were reviewed by me and considered in my medical decision making (see chart for details).   48 year old female presents after having an extraction of a diseased tooth on the right upper now presents with pain swelling and tenderness of the left upper canine.  All of her teeth are in extremely poor repair.  She is in the  process of having all of her teeth extracted.  Today she has evidence of a dental infection from the poor repair of her teeth.  We  will place her on Augmentin for 7 days.  Encouraged her to use salt water gargles frequently.  I will also prescribe a small amount of Vicodin for pain.  I have however encouraged her to use Tylenol 500 mg and ibuprofen 400 mg as much as possible.  She will follow-up with her dentist tomorrow or Tuesday.   Final Clinical Impressions(s) / UC Diagnoses   Final diagnoses:  Dental infection     Discharge Instructions     May use ice 15 to 20 minutes out of every 2 hours over the area of discomfort.  Use ibuprofen 400 mg combined with Tylenol 500 mg every 6 hours as necessary for pain.  Use hydrocodone on a as necessary basis for severe pain.  Be sure the total of the Tylenol that you use does not equal more than 4000 mg.  [facility]   ED Prescriptions    Medication Sig Dispense Auth. Provider   amoxicillin-clavulanate (AUGMENTIN) 875-125 MG tablet Take 1 tablet by mouth every 12 (twelve) hours. 14 tablet Ovid Curdoemer, Arwyn Besaw P, PA-C   HYDROcodone-acetaminophen (NORCO/VICODIN) 5-325 MG tablet Take 1-2 tablets by mouth every 6 (six) hours as needed. 10 tablet Lutricia Feiloemer, Dajae Kizer P, PA-C     Controlled Substance Prescriptions [place] Controlled Substance Registry consulted? Not Applicable   Lutricia FeilRoemer, Donold Marotto P, PA-C 12/16/18 1725

## 2019-04-02 ENCOUNTER — Other Ambulatory Visit: Payer: Self-pay

## 2019-04-02 ENCOUNTER — Encounter: Payer: Self-pay | Admitting: Physical Therapy

## 2019-04-02 ENCOUNTER — Ambulatory Visit: Payer: PRIVATE HEALTH INSURANCE | Attending: Family Medicine | Admitting: Physical Therapy

## 2019-04-02 DIAGNOSIS — M25611 Stiffness of right shoulder, not elsewhere classified: Secondary | ICD-10-CM | POA: Diagnosis present

## 2019-04-02 DIAGNOSIS — M25511 Pain in right shoulder: Secondary | ICD-10-CM | POA: Diagnosis not present

## 2019-04-02 NOTE — Therapy (Signed)
[facility] PHYSICAL AND SPORTS MEDICINE [address] [place], [place], [address] Phone: [phone]   Fax:  [phone]  Physical Therapy Treatment  Patient Details  Name: Stacy Curry MRN: [id number] Date of Birth: 12/14/1970 No data recorded  Encounter Date: 04/02/2019  PT End of Session - 04/02/19 1421    Visit Number  1    Number of Visits  17    Date for PT Re-Evaluation  05/28/19    PT Start Time  0200    PT Stop Time  0300    PT Time Calculation (min)  60 min    Activity Tolerance  Patient tolerated treatment well    Behavior During Therapy  [facility] for tasks assessed/performed       History reviewed. No pertinent past medical history.  Past Surgical History:  Procedure Laterality Date  . TUBAL LIGATION      There were no vitals filed for this visit.  Subjective Assessment - 04/02/19 1406    Pertinent History  Pt is a 48 year old female presenting with R shoulder pain since 03/20/19 following lifting a heavy trash bag. Reports she lifted with bilat UEs and immediately heard and felt her shoulder pop. Says immediately her shoulder "stiffened up"  and she has not been able to move her shoulder through a full ROM since. [name] is on light duty and is not able to complete overhead reaching or heavy lifting. Reports a constant burning sensation on back side of her shoulder blade that is constant, and reports stiffness in the shoulder that can become sharp with overhead motions. Worst pain over the past week 8/10 best 0/10. 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 Has difficult at home lifting her grandson (1248year old), completing household chores, and bathing Pt denies N/V, B&B changes, unexplained weight fluctuation, saddle paresthesia, fever, night sweats, or unrelenting night pain at this time.    Limitations  Lifting;House hold activities     How long can you sit comfortably?  unlimited    How long can you stand comfortably?  unlimited    How long can you walk comfortably?  unlimited    Diagnostic tests  None    Patient Stated Goals  Return to full work duties    Currently in Pain?  Yes    Pain Score  5     Pain Location  Shoulder    Pain Orientation  Right    Pain Descriptors / Indicators  Burning;Tightness;Cramping    Pain Type  Acute pain    Pain Radiating Towards  R shoulder and medial scapula    Pain Onset  1 to 4 weeks ago    Pain Frequency  Constant    Aggravating Factors   sudden reaching, wiping, overhead reaching, lifting    Pain Relieving Factors  heat, ice    Effect of Pain on Daily Activities  Unable to complete work duties and household ADLs without pain.       OBJECTIVE  MUSCULOSKELETAL: Tremor: Normal Bulk: Normal Tone: Normal  Cervical Screen AROM: WFL with tension in the neck with bilat rotation  Hoffman Sign (cervical cord compression): Negative bilat ULTT Median: R: Positive L: Negative  ULTT Ulnar:Negative bilat ULTT Radial: Negative bilat  Elbow Screen Elbow AROM: WNL bilat  Palpation TTP at bilat UT, R bicep and pec minor insertion (withdrawal and grimace), tension and guarding throughout R shoulder/periscapular muscles to all  palpation, especially ant shoulder musculature  Strength R/L 4+/5 Shoulder flexion (anterior deltoid/pec major/coracobrachialis, axillary n. (C5-6) and musculocutaneous n. (C5-7)) 4-/5 Shoulder abduction (deltoid/supraspinatus, axillary/suprascapular n, C5) 4-/5 Shoulder external rotation (infraspinatus/teres minor) 4+/5 Shoulder internal rotation (subcapularis/lats/pec major) 4+/5 Shoulder extension (posterior deltoid, lats, teres major, axillary/thoracodorsal n.) 5/5 Elbow flexion (biceps brachii, brachialis, brachioradialis, musculoskeletal n, C5-6) 4/5 Elbow extension (triceps, radial n, C7) 3+/4- Y 4/4+ T 4/4+ I 4/5 Latissimus 5/5 Wrist  Extension 5/5 Wrist Flexion 5/5 Finger adduction (interossei, ulnar n, T1)  AROM R/L 119/180 Shoulder flexion 76/180 Shoulder abduction C5/C8 Shoulder external rotation T12/T8 Shoulder internal rotation 60/60 Shoulder extension *Indicates pain, overpressure performed unless otherwise indicated  PROM R/L 120/180 Shoulder flexion 121/180 Shoulder abduction 65/90 Shoulder external rotation tested 90d of abd 60/70 Shoulder internal rotation testd 90d of abd 60/60 Shoulder extension *Indicates pain, overpressure performed unless otherwise indicated  Accessory Motions/Glides Glenohumeral: Posterior: WNL bilat with guarded endfeel on R Inferior: WNL, somewhat guarded Anterior: WNL bilat  Acromioclavicular:  Posterior: WNL bilat Anterior: WNL bilat  Sternoclavicular: WNL all directions  NEUROLOGICAL:  Mental Status Patient is oriented to person, place and time.  Recent memory is intact.  Remote memory is intact.  Attention span and concentration are intact.  Expressive speech is intact.  Patient's fund of knowledge is within normal limits for educational level.  Sensation Grossly intact to light touch bilateral UE as determined by testing dermatomes C2-T2 Proprioception and hot/cold testing deferred on this date  SPECIAL TESTS  Rotator Cuff  Drop Arm Test: Negative Painful Arc (Pain from 60 to 120 degrees scaption): Postive at 80d Infraspinatus Muscle Test: Negative  If all 3 tests positive, the probability of a full-thickness rotator cuff tear is 91%  Subacromial Impingement Hawkins-Kennedy: Positive R only  Neer (Block scapula, PROM flexion): Positive R only  Painful Arc (Pain from 60 to 120 degrees scaption): Positive R only  Empty Can: Positive R only  External Rotation Resistance: Positive R only   Labral Tear Biceps Load II (120 elevation, full ER, 90 elbow flexion, full supination, resisted elbow flexion):Postiive R Crank (160 scaption, axial load with  IR/ER): Negative bilat Active Compression Test: Negative bilat  Bicep Tendon Pathology Speed (shoulder flexion to 90, external rotation, full elbow extension, and forearm supination with resistance: Difficult but not painful Yergason's (resisted shoulder ER and supination/biceps tendon pathology): Negative  Shoulder Instability Sulcus Sign: Negative Anterior Apprehension: Negative   Ther-Ex - UT stretch x10sec hold x10 - Doorway pec/bicep stretch x30sec hold x2 - Thoracic ext over towel hands behind head x10 3sec hold - Bilat UE table slide flex x10 10sec hold Education on HEP given to increase motion, relieve muscular restrictions with good demonstration and understanding of frequency, rep/sets etc.                                 PT Education - 04/02/19 1420    Education Details  Patient was educated on diagnosis, anatomy and pathology involved, prognosis, role of PT, and was given an HEP, demonstrating exercise with proper form following verbal and tactile cues, and was given a paper hand out to continue exercise at home. Pt was educated on and agreed to plan of care.    Person(s) Educated  Patient    Methods  Explanation;Demonstration;Tactile cues;Verbal cues    Comprehension  Verbalized understanding;Returned demonstration;Verbal cues required;Tactile cues required       PT Short Term Goals -  04/03/19 1100      PT SHORT TERM GOAL #1   Title  Pt will be independent with HEP in order to improve strength and decrease pain in order to improve pain-free function at home and work.    Baseline  04/03/19    Time  4    Period  Weeks    Status  New      PT SHORT TERM GOAL #2   Title  Patient will demonstrate full passive ROM to demonstrate full ROM of joint needed to build AROM and strength    Baseline  04/03/19 shoulder flex 120 abd 121 ER 65 IR 60    Time  4    Period  Weeks    Status  New        PT Long Term Goals - 04/03/19 1051      PT LONG  TERM GOAL #1   Title  Pt will decrease quick DASH score by at least 8% in order to demonstrate clinically significant reduction in disability.    Baseline  04/03/19 79.5%    Time  8    Period  Weeks    Status  New      PT LONG TERM GOAL #2   Title  Pt will decrease worst pain as reported on NPRS by at least 3 points in order to demonstrate clinically significant reduction in pain.    Baseline  04/03/19 8/10 with overhead motion    Time  8    Period  Weeks    Status  New      PT LONG TERM GOAL #3   Title  Patient will demonstrate gross 5/5 R shoulder strength and 4+/5 periscapular strength in order to demonstrate symmetrical strength, and strength needed for overhead motion    Baseline  04/03/19 see eval    Time  8    Period  Weeks    Status  New      PT LONG TERM GOAL #4   Title  [name] will demonstrate full R shoulder AROM in order to complete ADLs (bathing, cleaning)    Baseline  04/03/19 R shoulder flex 119d abd 76d ER C5 IR T12    Time  8    Period  Weeks            Plan - 04/03/19 1105    Clinical Impression Statement  Pt is a 48 year old female pressenting with R shoulder/neck pain following lifting a heavy trash bag at work. Signs and symptoms consistent with subacromial shoulder impingement with increased gaurding and soft tissue restrictions. Impairments in PROM, AROM, shoulder and periscapular strength/activation, pain , and scapulo-humeral rhythm. 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 Would benefit from skilled PT to address above deficits and promote optimal return to PLOF.    Personal Factors and Comorbidities  Age;Behavior Pattern;Fitness;Past/Current Experience;Profession    Examination-Activity Limitations  Bathing;Dressing;Hygiene/Grooming;Lift;Carry;Reach Overhead     Examination-Participation Restrictions  Cleaning;Community Activity;Meal Prep;Other   work   Conservation officer, historic buildings  Evolving/Moderate complexity    Clinical Decision Making  Moderate    Rehab Potential  Good    PT Frequency  2x / week    PT Duration  8 weeks    PT Treatment/Interventions  Spinal Manipulations;Joint Manipulations;Dry needling;Moist Heat;Traction;Iontophoresis /ml Dexamethasone;Therapeutic exercise;Patient/family education;Manual techniques;Passive range of motion;Neuromuscular re-education;Functional mobility training;Therapeutic activities;Electrical Stimulation;ADLs/Self Care Home  Management;Cryotherapy;Ultrasound    PT Next Visit Plan  AAROM, motor control    PT Home Exercise Plan  UT stretch, doorway pec/bicep stretch, flex table slides, thoracic ext    Consulted and Agree with Plan of Care  Patient       Patient will benefit from skilled therapeutic intervention in order to improve the following deficits and impairments:  Increased muscle spasms, Decreased mobility, Decreased endurance, Decreased range of motion, Impaired tone, Improper body mechanics, Pain, Postural dysfunction, Impaired UE functional use, Impaired flexibility, Increased fascial restricitons, Decreased strength, Decreased coordination, Decreased activity tolerance  Visit Diagnosis: Acute pain of right shoulder  Stiffness of right shoulder, not elsewhere classified     Problem List There are no active problems to display for this patient.  Staci Acosta PT, DPT Staci Acosta 04/03/2019, 11:11 AM  [facility] PHYSICAL AND SPORTS MEDICINE [address], [place], [address] Phone: [phone]   Fax:  [phone]  Name: Pasqualina Swaziland MRN: [id number] Date of Birth: 1970/10/28

## 2019-04-03 ENCOUNTER — Encounter: Payer: Self-pay | Admitting: Physical Therapy

## 2019-04-04 ENCOUNTER — Ambulatory Visit: Payer: PRIVATE HEALTH INSURANCE | Admitting: Physical Therapy

## 2019-04-09 ENCOUNTER — Ambulatory Visit: Payer: PRIVATE HEALTH INSURANCE | Admitting: Physical Therapy

## 2019-04-09 ENCOUNTER — Encounter: Payer: Self-pay | Admitting: Physical Therapy

## 2019-04-09 ENCOUNTER — Other Ambulatory Visit: Payer: Self-pay

## 2019-04-09 DIAGNOSIS — M25611 Stiffness of right shoulder, not elsewhere classified: Secondary | ICD-10-CM

## 2019-04-09 DIAGNOSIS — M25511 Pain in right shoulder: Secondary | ICD-10-CM

## 2019-04-09 NOTE — Therapy (Signed)
[facility] [address] [place], [address] Phone: [phone]   Fax:  [phone]  Physical Therapy Treatment  Patient Details  Name: Stacy Curry MRN: [id number] Date of Birth: [birth year] No data recorded  Encounter Date: 04/09/2019  PT End of Session - 04/09/19 1041    Visit Number  2    Number of Visits  17    Date for PT Re-Evaluation  05/28/19    PT Start Time  1030    PT Stop Time  1115    PT Time Calculation (min)  45 min    Activity Tolerance  Patient tolerated treatment well    Behavior During Therapy  [facility] for tasks assessed/performed       History reviewed. No pertinent past medical history.  Past Surgical History:  Procedure Laterality Date  . TUBAL LIGATION      There were no vitals filed for this visit.  Subjective Assessment - 04/09/19 1032    Subjective  [name] reports she thinks her shoulder motion is getting better. Compliance with HEP. Reports minimal pain at rest, pain with overhead motions 5/10    Pertinent History  Pt is a 48 year old female presenting with R shoulder pain since 03/20/19 following lifting a heavy trash bag. Reports she lifted with bilat UEs and immediately heard and felt her shoulder pop. Says immediately her shoulder "stiffened up"  and she has not been able to move her shoulder through a full ROM since. [name] is on light duty and is not able to complete overhead reaching or heavy lifting. Reports a constant burning sensation on back side of her shoulder blade that is constant, and reports stiffness in the shoulder that can become sharp with overhead motions. Worst pain over the past week 8/10 best 0/10. 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 Has difficult at home lifting her grandson (35year old), completing household chores, and bathing Pt denies N/V, B&B  changes, unexplained weight fluctuation, saddle paresthesia, fever, night sweats, or unrelenting night pain at this time.    Limitations  Lifting;House hold activities    How long can you sit comfortably?  unlimited    How long can you stand comfortably?  unlimited    How long can you walk comfortably?  unlimited    Diagnostic tests  None    Patient Stated Goals  Return to full work duties    Pain Onset  1 to 4 weeks ago          Ther-Ex - Pulleys flex x56mins 5-10 hold in max range; x53min abd - UE ranger clockwise x15; counterclockwise x15 with cuing for full ROM - Scaption 2# 2x 10 with good carry over following demo and cuing to prevent shoulder hiking - Lat pulldown 20# 2x 10 with good carry over following demo - High row 10# 2x 10 with cuing for scapular retraction and to prevent shoulder hiking with good carry over - Eccentric bicep curl 4# bilat 2x 10 with cuing for true eccentric lower with good carry over - Bilat UT stretch x10sec hold x2 - Bilat levator stretch 2x 10sec hold - Doorway pec/bicep stretch x30sec hold                       PT Education - 04/09/19 1035    Education Details  therex form    Person(s) Educated  Patient    Methods  Explanation;Demonstration;Verbal cues    Comprehension  Verbalized understanding;Returned demonstration;Verbal cues required       PT Short Term Goals - 04/03/19 1100      PT SHORT TERM GOAL #1   Title  Pt will be independent with HEP in order to improve strength and decrease pain in order to improve pain-free function at home and work.    Baseline  04/03/19    Time  4    Period  Weeks    Status  New      PT SHORT TERM GOAL #2   Title  Patient will demonstrate full passive ROM to demonstrate full ROM of joint needed to build AROM and strength    Baseline  04/03/19 shoulder flex 120 abd 121 ER 65 IR 60    Time  4    Period  Weeks    Status  New        PT Long Term Goals - 04/03/19 1051      PT LONG  TERM GOAL #1   Title  Pt will decrease quick DASH score by at least 8% in order to demonstrate clinically significant reduction in disability.    Baseline  04/03/19 79.5%    Time  8    Period  Weeks    Status  New      PT LONG TERM GOAL #2   Title  Pt will decrease worst pain as reported on NPRS by at least 3 points in order to demonstrate clinically significant reduction in pain.    Baseline  04/03/19 8/10 with overhead motion    Time  8    Period  Weeks    Status  New      PT LONG TERM GOAL #3   Title  Patient will demonstrate gross 5/5 R shoulder strength and 4+/5 periscapular strength in order to demonstrate symmetrical strength, and strength needed for overhead motion    Baseline  04/03/19 see eval    Time  8    Period  Weeks    Status  New      PT LONG TERM GOAL #4   Title  [name] will demonstrate full R shoulder AROM in order to complete ADLs (bathing, cleaning)    Baseline  04/03/19 R shoulder flex 119d abd 76d ER C5 IR T12    Time  8    Period  Weeks            Plan - 04/09/19 1057    Clinical Impression Statement  PT led patient through therex for increased motion and strengthening with good success. Patient is able to complete all therex with proper form/technique following PT demo and cuing. PT willcontinue progression as able.    Personal Factors and Comorbidities  Age;Behavior Pattern;Fitness;Past/Current Experience;Profession    Examination-Activity Limitations  Bathing;Dressing;Hygiene/Grooming;Lift;Carry;Reach Overhead    Examination-Participation Restrictions  Cleaning;Community Activity;Meal Prep;Other    Stability/Clinical Decision Making  Evolving/Moderate complexity    Clinical Decision Making  Moderate    Rehab Potential  Good    PT Frequency  2x / week    PT Duration  8 weeks    PT Treatment/Interventions  Spinal Manipulations;Joint Manipulations;Dry needling;Moist Heat;Traction;Iontophoresis 4mg /ml Dexamethasone;Therapeutic exercise;Patient/family  education;Manual techniques;Passive range of motion;Neuromuscular re-education;Functional mobility training;Therapeutic activities;Electrical Stimulation;ADLs/Self Care Home Management;Cryotherapy;Ultrasound    PT Next Visit Plan  AAROM, motor control    PT Home Exercise Plan  UT stretch, doorway pec/bicep stretch, flex table slides, thoracic ext  Consulted and Agree with Plan of Care  Patient       Patient will benefit from skilled therapeutic intervention in order to improve the following deficits and impairments:  Increased muscle spasms, Decreased mobility, Decreased endurance, Decreased range of motion, Impaired tone, Improper body mechanics, Pain, Postural dysfunction, Impaired UE functional use, Impaired flexibility, Increased fascial restricitons, Decreased strength, Decreased coordination, Decreased activity tolerance  Visit Diagnosis: Acute pain of right shoulder  Stiffness of right shoulder, not elsewhere classified     Problem List There are no active problems to display for this patient.  Staci Acostahelsea Miller PT, DPT Staci Acostahelsea Miller 04/09/2019, 11:08 AM  [facility] [address] [place], [place], [id number] Phone: [phone]   Fax:  [phone]  Name: Stacy Curry MRN: [id number] Date of Birth: 25-Feb-1971

## 2019-04-16 ENCOUNTER — Ambulatory Visit: Payer: PRIVATE HEALTH INSURANCE | Attending: Physician Assistant | Admitting: Physical Therapy

## 2019-04-16 ENCOUNTER — Other Ambulatory Visit: Payer: Self-pay

## 2019-04-16 ENCOUNTER — Encounter: Payer: Self-pay | Admitting: Physical Therapy

## 2019-04-16 DIAGNOSIS — M25611 Stiffness of right shoulder, not elsewhere classified: Secondary | ICD-10-CM | POA: Diagnosis present

## 2019-04-16 DIAGNOSIS — M25511 Pain in right shoulder: Secondary | ICD-10-CM | POA: Diagnosis present

## 2019-04-16 NOTE — Therapy (Signed)
[facility] [address], [place], [address] Phone: [phone]   Fax:  [phone]  Physical Therapy Treatment  Patient Details  Name: Stacy Curry MRN: [id number] Date of Birth: 12/18/1970 No data recorded  Encounter Date: 04/16/2019  PT End of Session - 04/16/19 1401    Visit Number  3    Number of Visits  17    Date for PT Re-Evaluation  05/28/19    PT Start Time  0150    PT Stop Time  0230    PT Time Calculation (min)  40 min    Activity Tolerance  Patient tolerated treatment well    Behavior During Therapy  [facility] for tasks assessed/performed       History reviewed. No pertinent past medical history.  Past Surgical History:  Procedure Laterality Date  . TUBAL LIGATION      There were no vitals filed for this visit.  Subjective Assessment - 04/16/19 1354    Subjective  [name] reports she is not having pain today, just some stiffness with overhead motion.    Pertinent History  Pt is a 48 year old female presenting with R shoulder pain since 03/20/19 following lifting a heavy trash bag. Reports she lifted with bilat UEs and immediately heard and felt her shoulder pop. Says immediately her shoulder "stiffened up"  and she has not been able to move her shoulder through a full ROM since. [name] is on light duty and is not able to complete overhead reaching or heavy lifting. Reports a constant burning sensation on back side of her shoulder blade that is constant, and reports stiffness in the shoulder that can become sharp with overhead motions. Worst pain over the past week 8/10 best 0/10. 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 Has difficult at home lifting her grandson (68year old), completing household chores, and bathing Pt denies N/V, B&B changes, unexplained weight fluctuation, saddle paresthesia,  fever, night sweats, or unrelenting night pain at this time.    Limitations  Lifting;House hold activities    How long can you sit comfortably?  unlimited    How long can you stand comfortably?  unlimited    How long can you walk comfortably?  unlimited    Diagnostic tests  None    Patient Stated Goals  Return to full work duties       Ther-Ex - Pulleys flex x86mins 5-10 hold in max range; x59min abd Following pulleys full flex AROM, 160d  Abd, full ER, IR to T10  - AAROM IR with towel x15 5sec hold in max IR - 90/90 ER RTB 3x 10 with min cuing for maintained positioning with good carry over - Standing IR GTB 3x 10 with demo and cuing for initial set up with good carry over - RUE row 5# x10; 10# 2x 10 with max cuing for proper technique without excessive scapular protraction with good carry over - Seated military press 3# 3x 10 with max cuing initially for proper technique with good carry over - Bent over rows 15# 3x 10 with good carry over following demo and initial cuing for posture          PT Education - 04/16/19 1401    Education Details  therex form    Person(s) Educated  Patient    Methods  Explanation;Demonstration;Verbal cues    Comprehension  Verbalized understanding;Returned demonstration;Verbal cues required  PT Short Term Goals - 04/03/19 1100      PT SHORT TERM GOAL #1   Title  Pt will be independent with HEP in order to improve strength and decrease pain in order to improve pain-free function at home and work.    Baseline  04/03/19    Time  4    Period  Weeks    Status  New      PT SHORT TERM GOAL #2   Title  Patient will demonstrate full passive ROM to demonstrate full ROM of joint needed to build AROM and strength    Baseline  04/03/19 shoulder flex 120 abd 121 ER 65 IR 60    Time  4    Period  Weeks    Status  New        PT Long Term Goals - 04/03/19 1051      PT LONG TERM GOAL #1   Title  Pt will decrease quick DASH score by at least 8%  in order to demonstrate clinically significant reduction in disability.    Baseline  04/03/19 79.5%    Time  8    Period  Weeks    Status  New      PT LONG TERM GOAL #2   Title  Pt will decrease worst pain as reported on NPRS by at least 3 points in order to demonstrate clinically significant reduction in pain.    Baseline  04/03/19 8/10 with overhead motion    Time  8    Period  Weeks    Status  New      PT LONG TERM GOAL #3   Title  Patient will demonstrate gross 5/5 R shoulder strength and 4+/5 periscapular strength in order to demonstrate symmetrical strength, and strength needed for overhead motion    Baseline  04/03/19 see eval    Time  8    Period  Weeks    Status  New      PT LONG TERM GOAL #4   Title  [name] will demonstrate full R shoulder AROM in order to complete ADLs (bathing, cleaning)    Baseline  04/03/19 R shoulder flex 119d abd 76d ER C5 IR T12    Time  8    Period  Weeks            Plan - 04/16/19 1419    Clinical Impression Statement  PT continued progression for UE strengthening with good success. Pt is able to complete all therex with proper technique following cuing/demo with planned breaks, no increased pain, only muscle fatigue. PT will continue progression as able.    Personal Factors and Comorbidities  Age;Behavior Pattern;Fitness;Past/Current Experience;Profession    Examination-Activity Limitations  Bathing;Dressing;Hygiene/Grooming;Lift;Carry;Reach Overhead    Examination-Participation Restrictions  Cleaning;Community Activity;Meal Prep;Other    Stability/Clinical Decision Making  Evolving/Moderate complexity    Clinical Decision Making  Moderate    Rehab Potential  Good    PT Frequency  2x / week    PT Duration  8 weeks    PT Treatment/Interventions  Spinal Manipulations;Joint Manipulations;Dry needling;Moist Heat;Traction;Iontophoresis 4mg /ml Dexamethasone;Therapeutic exercise;Patient/family education;Manual techniques;Passive range of  motion;Neuromuscular re-education;Functional mobility training;Therapeutic activities;Electrical Stimulation;ADLs/Self Care Home Management;Cryotherapy;Ultrasound    PT Next Visit Plan  AAROM, motor control    PT Home Exercise Plan  UT stretch, doorway pec/bicep stretch, flex table slides, thoracic ext    Consulted and Agree with Plan of Care  Patient       Patient will benefit from skilled therapeutic  intervention in order to improve the following deficits and impairments:  Increased muscle spasms, Decreased mobility, Decreased endurance, Decreased range of motion, Impaired tone, Improper body mechanics, Pain, Postural dysfunction, Impaired UE functional use, Impaired flexibility, Increased fascial restricitons, Decreased strength, Decreased coordination, Decreased activity tolerance  Visit Diagnosis: Acute pain of right shoulder  Stiffness of right shoulder, not elsewhere classified     Problem List There are no active problems to display for this patient.  Staci Acosta PT, DPT Staci Acosta 04/16/2019, 2:29 PM  [facility] [address] [place], [address] Phone: [phone]   Fax:  [phone]  Name: Stacy Curry MRN: [id number] Date of Birth: 1970/08/26

## 2019-04-18 ENCOUNTER — Encounter: Payer: Self-pay | Admitting: Physical Therapy

## 2019-04-18 ENCOUNTER — Other Ambulatory Visit: Payer: Self-pay

## 2019-04-18 ENCOUNTER — Ambulatory Visit: Payer: PRIVATE HEALTH INSURANCE | Admitting: Physical Therapy

## 2019-04-18 DIAGNOSIS — M25511 Pain in right shoulder: Secondary | ICD-10-CM | POA: Diagnosis not present

## 2019-04-18 DIAGNOSIS — M25611 Stiffness of right shoulder, not elsewhere classified: Secondary | ICD-10-CM

## 2019-04-18 NOTE — Therapy (Signed)
[facility] [address], [place], [address] Phone: [phone]   Fax:  [phone]  Physical Therapy Treatment  Patient Details  Name: Stacy Curry MRN: [id number] Date of Birth: Apr 20, 1971 No data recorded  Encounter Date: 04/18/2019    History reviewed. No pertinent past medical history.  Past Surgical History:  Procedure Laterality Date  . TUBAL LIGATION      There were no vitals filed for this visit.  Subjective Assessment - 04/18/19 1601    Subjective  [name] reports no pain, with motion imrpoving.    Pertinent History  Pt is a 48 year old female presenting with R shoulder pain since 03/20/19 following lifting a heavy trash bag. Reports she lifted with bilat UEs and immediately heard and felt her shoulder pop. Says immediately her shoulder "stiffened up"  and she has not been able to move her shoulder through a full ROM since. [name] is on light duty and is not able to complete overhead reaching or heavy lifting. Reports a constant burning sensation on back side of her shoulder blade that is constant, and reports stiffness in the shoulder that can become sharp with overhead motions. Worst pain over the past week 8/10 best 0/10. 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 Has difficult at home lifting her grandson (67year old), completing household chores, and bathing Pt denies N/V, B&B changes, unexplained weight fluctuation, saddle paresthesia, fever, night sweats, or unrelenting night pain at this time.    Limitations  Lifting;House hold activities    How long can you sit comfortably?  unlimited    How long can you stand comfortably?  unlimited    How long can you walk comfortably?  unlimited    Diagnostic tests  None    Patient Stated Goals  Return to full work duties         Ther-Ex -Pulleys  flex x85mins 5-10 hold in max range; x54min abd (prior to session lacking 10d abd and flex) full ER/IR with some stiffness at end range IR - AAROM IR with towel x15 5sec hold in max IR - 90/90 ER 5# 3x 10 with min cuing for initial set up with reps with good carry over - Seated chest press 25# 3x 10 with cuing for eccentric control with good carry over - RUE 15# 3x 10 with max cuing for proper technique without excessive scapular protraction with good carry over - Seated military press 5# x10; 7# 2x 10 with cuing for scapular retraction with good over - Standing D2 GTB x10; BluTB 2x 10 with min cuing for set up and eccentric control with good carry over - Unilateral farmers carry 20# 3x 82ft with cuing for set up neutral posture with good carry over                         PT Short Term Goals - 04/03/19 1100      PT SHORT TERM GOAL #1   Title  Pt will be independent with HEP in order to improve strength and decrease pain in order to improve pain-free function at home and work.    Baseline  04/03/19    Time  4    Period  Weeks    Status  New      PT SHORT TERM GOAL #2   Title  Patient will demonstrate full passive ROM  to demonstrate full ROM of joint needed to build AROM and strength    Baseline  04/03/19 shoulder flex 120 abd 121 ER 65 IR 60    Time  4    Period  Weeks    Status  New        PT Long Term Goals - 04/03/19 1051      PT LONG TERM GOAL #1   Title  Pt will decrease quick DASH score by at least 8% in order to demonstrate clinically significant reduction in disability.    Baseline  04/03/19 79.5%    Time  8    Period  Weeks    Status  New      PT LONG TERM GOAL #2   Title  Pt will decrease worst pain as reported on NPRS by at least 3 points in order to demonstrate clinically significant reduction in pain.    Baseline  04/03/19 8/10 with overhead motion    Time  8    Period  Weeks    Status  New      PT LONG TERM GOAL #3   Title  Patient will  demonstrate gross 5/5 R shoulder strength and 4+/5 periscapular strength in order to demonstrate symmetrical strength, and strength needed for overhead motion    Baseline  04/03/19 see eval    Time  8    Period  Weeks    Status  New      PT LONG TERM GOAL #4   Title  [name] will demonstrate full R shoulder AROM in order to complete ADLs (bathing, cleaning)    Baseline  04/03/19 R shoulder flex 119d abd 76d ER C5 IR T12    Time  8    Period  Weeks              Patient will benefit from skilled therapeutic intervention in order to improve the following deficits and impairments:     Visit Diagnosis: No diagnosis found.     Problem List There are no active problems to display for this patient.  Shelton Silvas PT, DPT Shelton Silvas 04/18/2019, 4:02 PM  [place] [facility] [address], [place], [address] Phone: [phone]   Fax:  [phone]  Name: Stacy Curry MRN: [id number] Date of Birth: 03-31-1971

## 2019-04-23 ENCOUNTER — Ambulatory Visit: Payer: PRIVATE HEALTH INSURANCE | Attending: Family Medicine | Admitting: Physical Therapy

## 2019-04-23 ENCOUNTER — Encounter: Payer: Self-pay | Admitting: Physical Therapy

## 2019-04-23 ENCOUNTER — Other Ambulatory Visit: Payer: Self-pay

## 2019-04-23 DIAGNOSIS — M25511 Pain in right shoulder: Secondary | ICD-10-CM | POA: Insufficient documentation

## 2019-04-23 DIAGNOSIS — M25611 Stiffness of right shoulder, not elsewhere classified: Secondary | ICD-10-CM | POA: Diagnosis present

## 2019-04-23 NOTE — Therapy (Signed)
[facility] [address] [place], [address] Phone: [phone]   Fax:  [phone]  Physical Therapy Treatment  Patient Details  Name: Stacy Curry MRN: [id number] Date of Birth: 10-06-1970 No data recorded  Encounter Date: 04/23/2019  PT End of Session - 04/23/19 1607    Visit Number  5    Number of Visits  17    Date for PT Re-Evaluation  05/28/19    PT Start Time  0400    PT Stop Time  0445    PT Time Calculation (min)  45 min    Activity Tolerance  Patient tolerated treatment well    Behavior During Therapy  [facility] for tasks assessed/performed       History reviewed. No pertinent past medical history.  Past Surgical History:  Procedure Laterality Date  . TUBAL LIGATION      There were no vitals filed for this visit.  Subjective Assessment - 04/23/19 1604    Subjective  [name] reports she was sore following last session, lasted about 24hours. Still has increased pain with repitive wiping for R shoulder. No pain at rest, when she has pain with work duties 5-6/10 pain that she describes is "throbbing"    Pertinent History  Pt is a 48 year old female presenting with R shoulder pain since 03/20/19 following lifting a heavy trash bag. Reports she lifted with bilat UEs and immediately heard and felt her shoulder pop. Says immediately her shoulder "stiffened up"  and she has not been able to move her shoulder through a full ROM since. [name] is on light duty and is not able to complete overhead reaching or heavy lifting. Reports a constant burning sensation on back side of her shoulder blade that is constant, and reports stiffness in the shoulder that can become sharp with overhead motions. Worst pain over the past week 8/10 best 0/10. 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 Has difficult at home lifting her  grandson (32year old), completing household chores, and bathing Pt denies N/V, B&B changes, unexplained weight fluctuation, saddle paresthesia, fever, night sweats, or unrelenting night pain at this time.    Limitations  Lifting;House hold activities    How long can you sit comfortably?  unlimited    How long can you stand comfortably?  unlimited    How long can you walk comfortably?  unlimited    Diagnostic tests  None    Patient Stated Goals  Return to full work duties    Pain Onset  1 to 4 weeks ago      Ther-Ex -UBE L1 60min FWD 23min backward - AROM all WNL with gaurding and "pulling with end range ER and IR - High row 20# 3x 10 with good carry over of cuing for cervical neutral  - 90/90 ER 5# 3x 10 with min cuing for initial set up with reps with good carry over - Seated chest press 25# 3x 10 with cuing for eccentric control with good carry over - Deadlift/hip hinge x10 with demo and heavy cuing for proper technique  With 10# DB bilat 2x 10 with TC for maintaining scapular retraction with good carry over - Standing D2 BluTB 3x 10 with min cuing for full ROM with good carry over - Scaption 3x 10 4# with good control, cuing for scapular retraction  PT Education - 04/23/19 1607    Education Details  therex form    Person(s) Educated  Patient    Methods  Explanation;Demonstration;Verbal cues    Comprehension  Verbalized understanding;Returned demonstration;Verbal cues required       PT Short Term Goals - 04/03/19 1100      PT SHORT TERM GOAL #1   Title  Pt will be independent with HEP in order to improve strength and decrease pain in order to improve pain-free function at home and work.    Baseline  04/03/19    Time  4    Period  Weeks    Status  New      PT SHORT TERM GOAL #2   Title  Patient will demonstrate full passive ROM to demonstrate full ROM of joint needed to build AROM and strength    Baseline  04/03/19 shoulder flex  120 abd 121 ER 65 IR 60    Time  4    Period  Weeks    Status  New        PT Long Term Goals - 04/03/19 1051      PT LONG TERM GOAL #1   Title  Pt will decrease quick DASH score by at least 8% in order to demonstrate clinically significant reduction in disability.    Baseline  04/03/19 79.5%    Time  8    Period  Weeks    Status  New      PT LONG TERM GOAL #2   Title  Pt will decrease worst pain as reported on NPRS by at least 3 points in order to demonstrate clinically significant reduction in pain.    Baseline  04/03/19 8/10 with overhead motion    Time  8    Period  Weeks    Status  New      PT LONG TERM GOAL #3   Title  Patient will demonstrate gross 5/5 R shoulder strength and 4+/5 periscapular strength in order to demonstrate symmetrical strength, and strength needed for overhead motion    Baseline  04/03/19 see eval    Time  8    Period  Weeks    Status  New      PT LONG TERM GOAL #4   Title  [name] will demonstrate full R shoulder AROM in order to complete ADLs (bathing, cleaning)    Baseline  04/03/19 R shoulder flex 119d abd 76d ER C5 IR T12    Time  8    Period  Weeks            Plan - 04/23/19 1616    Clinical Impression Statement  PT continued therex progression for increased shoulder girdle strength with good success. Utilized longer rest breaks between sets to reduce post exercise soreness. Pt is able to complete all therex with proper technique following min cuing, and will gauge post therex pain next session. PT wil lcontinue porgression as able.    Personal Factors and Comorbidities  Age;Behavior Pattern;Fitness;Past/Current Experience;Profession    Examination-Activity Limitations  Bathing;Dressing;Hygiene/Grooming;Lift;Carry;Reach Overhead    Examination-Participation Restrictions  Cleaning;Community Activity;Meal Prep;Other    Stability/Clinical Decision Making  Evolving/Moderate complexity    Clinical Decision Making  Moderate    Rehab  Potential  Good    PT Frequency  2x / week    PT Duration  8 weeks    PT Treatment/Interventions  Spinal Manipulations;Joint Manipulations;Dry needling;Moist Heat;Traction;Iontophoresis 4mg /ml Dexamethasone;Therapeutic exercise;Patient/family education;Manual techniques;Passive range of motion;Neuromuscular re-education;Functional mobility training;Therapeutic activities;  Stimulation;ADLs/Self Care Home Management;Cryotherapy;Ultrasound    PT Next Visit Plan  strengthening    PT Home Exercise Plan  UT stretch, doorway pec/bicep stretch, flex table slides, thoracic ext    Consulted and Agree with Plan of Care  Patient       Patient will benefit from skilled therapeutic intervention in order to improve the following deficits and impairments:  Increased muscle spasms, Decreased mobility, Decreased endurance, Decreased range of motion, Impaired tone, Improper body mechanics, Pain, Postural dysfunction, Impaired UE functional use, Impaired flexibility, Increased fascial restricitons, Decreased strength, Decreased coordination, Decreased activity tolerance  Visit Diagnosis: Acute pain of right shoulder  Stiffness of right shoulder, not elsewhere classified     Problem List There are no active problems to display for this patient.  Staci Acostahelsea Miller PT, DPT Staci Acostahelsea Miller 04/23/2019, 4:38 PM  [facility] [address] [place], [place], [id number] Phone: [phone]   Fax:  [phone]  Name: Stacy Curry MRN: [id number] Date of Birth: 1970/09/03

## 2019-04-25 ENCOUNTER — Encounter: Payer: Self-pay | Admitting: Physical Therapy

## 2019-04-25 ENCOUNTER — Other Ambulatory Visit: Payer: Self-pay

## 2019-04-25 ENCOUNTER — Ambulatory Visit: Payer: PRIVATE HEALTH INSURANCE | Attending: Family Medicine | Admitting: Physical Therapy

## 2019-04-25 DIAGNOSIS — M25611 Stiffness of right shoulder, not elsewhere classified: Secondary | ICD-10-CM | POA: Insufficient documentation

## 2019-04-25 DIAGNOSIS — M25511 Pain in right shoulder: Secondary | ICD-10-CM | POA: Diagnosis not present

## 2019-04-25 NOTE — Therapy (Signed)
[facility] [address] [place], [address] Phone: [phone]   Fax:  [phone]  Physical Therapy Treatment  Patient Details  Name: Stacy Curry MRN: [id number] Date of Birth: [birth year] No data recorded  Encounter Date: 04/25/2019  PT End of Session - 04/25/19 1608    Visit Number  6    Number of Visits  17    Date for PT Re-Evaluation  05/28/19    PT Start Time  0404    PT Stop Time  0445    PT Time Calculation (min)  41 min    Activity Tolerance  Patient tolerated treatment well    Behavior During Therapy  [facility] for tasks assessed/performed       History reviewed. No pertinent past medical history.  Past Surgical History:  Procedure Laterality Date  . TUBAL LIGATION      There were no vitals filed for this visit.  Subjective Assessment - 04/25/19 1607    Subjective  Reports no pain in the shoulder today. Some soreness following last session.    Pertinent History  Pt is a 48 year old female presenting with R shoulder pain since 03/20/19 following lifting a heavy trash bag. Reports she lifted with bilat UEs and immediately heard and felt her shoulder pop. Says immediately her shoulder "stiffened up"  and she has not been able to move her shoulder through a full ROM since. [name] is on light duty and is not able to complete overhead reaching or heavy lifting. Reports a constant burning sensation on back side of her shoulder blade that is constant, and reports stiffness in the shoulder that can become sharp with overhead motions. Worst pain over the past week 8/10 best 0/10. 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 Has difficult at home lifting her grandson (42year old), completing household chores, and bathing Pt denies N/V, B&B changes, unexplained weight fluctuation, saddle paresthesia, fever, night  sweats, or unrelenting night pain at this time.    Limitations  Lifting;House hold activities    How long can you sit comfortably?  unlimited    How long can you stand comfortably?  unlimited    How long can you walk comfortably?  unlimited    Diagnostic tests  None    Patient Stated Goals  Return to full work duties          Ther-Ex -UBE L1 FWD backward - AROM all WNL with gaurding and "pulling with end range IR - Upright row BluTB 3x 10 with some discomfort, no increased pain.  - Deadlift review with good form - Deadlift 10# DB BUE 3x 10 with min cuing for maintained scapular retraction initially with good carry over following - Squat with 30# box 3x 10; demo and cuing initially with good carry over of neutral spine and maintained shoulder stability with good carry over - High row 20# 3x 10 with good carry over of cuing for cervical neutral  - Scaption 3x 10 5# with good control, cuing for scapular retraction - Alt wall scap taps in wall push up RTB 2x 10 with cuing for scapular control with good carry over                       PT Short Term Goals - 04/03/19 1100      PT SHORT TERM GOAL #1  Title  Pt will be independent with HEP in order to improve strength and decrease pain in order to improve pain-free function at home and work.    Baseline  04/03/19    Time  4    Period  Weeks    Status  New      PT SHORT TERM GOAL #2   Title  Patient will demonstrate full passive ROM to demonstrate full ROM of joint needed to build AROM and strength    Baseline  04/03/19 shoulder flex 120 abd 121 ER 65 IR 60    Time  4    Period  Weeks    Status  New        PT Long Term Goals - 04/03/19 1051      PT LONG TERM GOAL #1   Title  Pt will decrease quick DASH score by at least 8% in order to demonstrate clinically significant reduction in disability.    Baseline  04/03/19 79.5%    Time  8    Period  Weeks    Status  New      PT LONG TERM GOAL #2    Title  Pt will decrease worst pain as reported on NPRS by at least 3 points in order to demonstrate clinically significant reduction in pain.    Baseline  04/03/19 8/10 with overhead motion    Time  8    Period  Weeks    Status  New      PT LONG TERM GOAL #3   Title  Patient will demonstrate gross 5/5 R shoulder strength and 4+/5 periscapular strength in order to demonstrate symmetrical strength, and strength needed for overhead motion    Baseline  04/03/19 see eval    Time  8    Period  Weeks    Status  New      PT LONG TERM GOAL #4   Title  [name] will demonstrate full R shoulder AROM in order to complete ADLs (bathing, cleaning)    Baseline  04/03/19 R shoulder flex 119d abd 76d ER C5 IR T12    Time  8    Period  Weeks            Plan - 04/25/19 1642    Clinical Impression Statement  PT continued therex progression with functional activities with good success. Patient is able to complete all therex with proper technique following some demo and cuing. [name] making weekly progress. Patient will continue progression as able.    Personal Factors and Comorbidities  Age;Behavior Pattern;Fitness;Past/Current Experience;Profession    Examination-Activity Limitations  Bathing;Dressing;Hygiene/Grooming;Lift;Carry;Reach Overhead    Examination-Participation Restrictions  Cleaning;Community Activity;Meal Prep;Other    Stability/Clinical Decision Making  Evolving/Moderate complexity    Clinical Decision Making  Moderate    Rehab Potential  Good    PT Frequency  2x / week    PT Duration  8 weeks    PT Treatment/Interventions  Spinal Manipulations;Joint Manipulations;Dry needling;Moist Heat;Traction;Iontophoresis 4mg /ml Dexamethasone;Therapeutic exercise;Patient/family education;Manual techniques;Passive range of motion;Neuromuscular re-education;Functional mobility training;Therapeutic activities;Electrical Stimulation;ADLs/Self Care Home Management;Cryotherapy;Ultrasound    PT Next  Visit Plan  strengthening    PT Home Exercise Plan  UT stretch, doorway pec/bicep stretch, flex table slides, thoracic ext    Consulted and Agree with Plan of Care  Patient       Patient will benefit from skilled therapeutic intervention in order to improve the following deficits and impairments:  Increased muscle spasms, Decreased mobility, Decreased endurance, Decreased range of motion,  Impaired tone, Improper body mechanics, Pain, Postural dysfunction, Impaired UE functional use, Impaired flexibility, Increased fascial restricitons, Decreased strength, Decreased coordination, Decreased activity tolerance  Visit Diagnosis: Acute pain of right shoulder  Stiffness of right shoulder, not elsewhere classified     Problem List There are no problems to display for this patient.  Shelton Silvas PT, DPT Shelton Silvas 04/25/2019, 4:46 PM  [facility] [address] [place], [address] Phone: [phone]   Fax:  [phone]  Name: Stacy Curry MRN: [id number] Date of Birth: 1970-08-15

## 2019-04-30 ENCOUNTER — Other Ambulatory Visit: Payer: Self-pay

## 2019-04-30 ENCOUNTER — Encounter: Payer: Self-pay | Admitting: Physical Therapy

## 2019-04-30 ENCOUNTER — Ambulatory Visit: Payer: PRIVATE HEALTH INSURANCE | Admitting: Physical Therapy

## 2019-04-30 DIAGNOSIS — M25511 Pain in right shoulder: Secondary | ICD-10-CM | POA: Diagnosis not present

## 2019-04-30 DIAGNOSIS — M25611 Stiffness of right shoulder, not elsewhere classified: Secondary | ICD-10-CM

## 2019-04-30 NOTE — Therapy (Signed)
[facility] [address] [place], [address] Phone: [phone]   Fax:  [phone]  Physical Therapy Treatment  Patient Details  Name: Stacy Curry MRN: [id number] Date of Birth: 09-20-1970 No data recorded  Encounter Date: 04/30/2019  PT End of Session - 04/30/19 1628    Visit Number  7    Number of Visits  17    Date for PT Re-Evaluation  05/28/19    PT Start Time  0415    PT Stop Time  0500    PT Time Calculation (min)  45 min    Activity Tolerance  Patient tolerated treatment well    Behavior During Therapy  [facility] for tasks assessed/performed       History reviewed. No pertinent past medical history.  Past Surgical History:  Procedure Laterality Date  . TUBAL LIGATION      There were no vitals filed for this visit.  Subjective Assessment - 04/30/19 1626    Subjective  Pt continues to report no pain, just some weakness in the shoulder.    Pertinent History  Pt is a 48 year old female presenting with R shoulder pain since 03/20/19 following lifting a heavy trash bag. Reports she lifted with bilat UEs and immediately heard and felt her shoulder pop. Says immediately her shoulder "stiffened up"  and she has not been able to move her shoulder through a full ROM since. [name] is on light duty and is not able to complete overhead reaching or heavy lifting. Reports a constant burning sensation on back side of her shoulder blade that is constant, and reports stiffness in the shoulder that can become sharp with overhead motions. Worst pain over the past week 8/10 best 0/10. 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 Has difficult at home lifting her grandson (29year old), completing household chores, and bathing Pt denies N/V, B&B changes, unexplained weight fluctuation, saddle paresthesia, fever, night sweats, or  unrelenting night pain at this time.    Limitations  Lifting;House hold activities    How long can you sit comfortably?  unlimited    How long can you stand comfortably?  unlimited    How long can you walk comfortably?  unlimited    Diagnostic tests  None    Patient Stated Goals  Return to full work duties       Ther-Ex -UBE L1 FWD backward - Bent over rows 25# x10 35# 2x 10 with cuing for set up, good carry over - Upright row 20# 3x 10 with good carry over of proper technique - Small body blade flex 3x 10sec; abd 3x 10 sec with demo and max cuing needed initially for technique with good carry over following; mostly to prevent excessive protraction - Farmers carry 20# KB 123ft with cuing for initial posture with good carry over - Overhead farmers carry 10# 2x 5ft with cuing initially for scap retraciton with good carry over - Straight leg deadlift 10# DB BUE 3x 10 with min cuing for maintained scapular retraction initially with good carry over following                          PT Education - 04/30/19 1627    Education Details  therex form    Person(s) Educated  Patient    Methods  Explanation;Demonstration;Verbal cues    Comprehension  Verbalized understanding;Returned demonstration;Verbal cues required       PT Short Term Goals - 04/03/19 1100      PT SHORT TERM GOAL #1   Title  Pt will be independent with HEP in order to improve strength and decrease pain in order to improve pain-free function at home and work.    Baseline  04/03/19    Time  4    Period  Weeks    Status  New      PT SHORT TERM GOAL #2   Title  Patient will demonstrate full passive ROM to demonstrate full ROM of joint needed to build AROM and strength    Baseline  04/03/19 shoulder flex 120 abd 121 ER 65 IR 60    Time  4    Period  Weeks    Status  New        PT Long Term Goals - 04/03/19 1051      PT LONG TERM GOAL #1   Title  Pt will decrease quick DASH score by  at least 8% in order to demonstrate clinically significant reduction in disability.    Baseline  04/03/19 79.5%    Time  8    Period  Weeks    Status  New      PT LONG TERM GOAL #2   Title  Pt will decrease worst pain as reported on NPRS by at least 3 points in order to demonstrate clinically significant reduction in pain.    Baseline  04/03/19 8/10 with overhead motion    Time  8    Period  Weeks    Status  New      PT LONG TERM GOAL #3   Title  Patient will demonstrate gross 5/5 R shoulder strength and 4+/5 periscapular strength in order to demonstrate symmetrical strength, and strength needed for overhead motion    Baseline  04/03/19 see eval    Time  8    Period  Weeks    Status  New      PT LONG TERM GOAL #4   Title  [name] will demonstrate full R shoulder AROM in order to complete ADLs (bathing, cleaning)    Baseline  04/03/19 R shoulder flex 119d abd 76d ER C5 IR T12    Time  8    Period  Weeks            Plan - 04/30/19 1639    Clinical Impression Statement  PT continued therex progression for increased strengthening and functional movements with good success. Patient is able to comply with all cuing for proper technique with good success. PT will continue progression as able.    Personal Factors and Comorbidities  Age;Behavior Pattern;Fitness;Past/Current Experience;Profession    Examination-Activity Limitations  Bathing;Dressing;Hygiene/Grooming;Lift;Carry;Reach Overhead    Examination-Participation Restrictions  Cleaning;Community Activity;Meal Prep;Other    Stability/Clinical Decision Making  Evolving/Moderate complexity    Clinical Decision Making  Moderate    Rehab Potential  Good    PT Frequency  2x / week    PT Duration  8 weeks    PT Treatment/Interventions  Spinal Manipulations;Joint Manipulations;Dry needling;Moist Heat;Traction;Iontophoresis 4mg /ml Dexamethasone;Therapeutic exercise;Patient/family education;Manual techniques;Passive range of  motion;Neuromuscular re-education;Functional mobility training;Therapeutic activities;Electrical Stimulation;ADLs/Self Care Home Management;Cryotherapy;Ultrasound    PT Next Visit Plan  strengthening    PT Home Exercise Plan  UT stretch, doorway pec/bicep stretch, flex table slides, thoracic ext    Consulted and Agree with Plan of Care  Patient  Patient will benefit from skilled therapeutic intervention in order to improve the following deficits and impairments:  Increased muscle spasms, Decreased mobility, Decreased endurance, Decreased range of motion, Impaired tone, Improper body mechanics, Pain, Postural dysfunction, Impaired UE functional use, Impaired flexibility, Increased fascial restricitons, Decreased strength, Decreased coordination, Decreased activity tolerance  Visit Diagnosis: Stiffness of right shoulder, not elsewhere classified  Acute pain of right shoulder     Problem List There are no problems to display for this patient.  Shelton Silvas PT, DPT Shelton Silvas 04/30/2019, 4:54 PM  [place] [facility] [address] [place], [address] Phone: [phone]   Fax:  [phone]  Name: Delona Martinique MRN: [id number] Date of Birth: [birth year]

## 2019-05-02 ENCOUNTER — Encounter: Payer: Self-pay | Admitting: Physical Therapy

## 2019-05-02 ENCOUNTER — Other Ambulatory Visit: Payer: Self-pay

## 2019-05-02 ENCOUNTER — Ambulatory Visit: Payer: PRIVATE HEALTH INSURANCE | Admitting: Physical Therapy

## 2019-05-02 DIAGNOSIS — M25511 Pain in right shoulder: Secondary | ICD-10-CM | POA: Diagnosis not present

## 2019-05-02 DIAGNOSIS — M25611 Stiffness of right shoulder, not elsewhere classified: Secondary | ICD-10-CM

## 2019-05-02 NOTE — Therapy (Signed)
[facility] PHYSICAL AND SPOR[facility] MEDICINE [address] [place], [place], [address] Phone: [phone]   Fax:  [phone]  Physical Therapy Treatment  Patient Details  Name: Stacy Curry MRN: [id number] Date of Birth: [birth year] No data recorded  Encounter Date: 05/02/2019  PT End of Session - 05/02/19 1654    Visit Number  8    Number of Visits  17    Date for PT Re-Evaluation  05/28/19    PT Start Time  0445    PT Stop Time  0530    PT Time Calculation (min)  45 min    Activity Tolerance  Patient tolerated treatment well    Behavior During Therapy  [facility]       History reviewed. No pertinent past medical history.  Past Surgical History:  Procedure Laterality Date  . TUBAL LIGATION      There were no vitals filed for this visit.  Subjective Assessment - 05/02/19 1648    Subjective  Reports no shoulder pain today, good motion.    Pertinent History  Pt is a 48 year old female presenting with R shoulder pain since 03/20/19 following lifting a heavy trash bag. Reports she lifted with bilat UEs and immediately heard and felt her shoulder pop. Says immediately her shoulder "stiffened up"  and she has not been able to move her shoulder through a full ROM since. [name] is on light duty and is not able to complete overhead reaching or heavy lifting. Reports a constant burning sensation on back side of her shoulder blade that is constant, and reports stiffness in the shoulder that can become sharp with overhead motions. Worst pain over the past week 8/10 best 0/10. 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 Has difficult at home lifting her grandson (3473year old), completing household chores, and bathing Pt denies N/V, B&B changes, unexplained weight fluctuation, saddle paresthesia, fever, night sweats, or unrelenting night pain  at this time.    Limitations  Lifting;House hold activities    How long can you sit comfortably?  unlimited    How long can you stand comfortably?  unlimited    How long can you walk comfortably?  unlimited    Diagnostic tests  None    Patient Stated Goals  Return to full work duties          Ther-Ex -UBE L1 3min FWD 3min backward AROM assessment with some remaining difficulty and pain with IR; all other motions pain free WNL - Cable IR 5# x10; with min cuing initially for set up with good carry over - D2 ext with IR 5# 3x 10 with good carry over following demo and cuing to prevent trunk compensation - Upright row 25# 3x 10 with good carry over of proper technique - Omega overhead press 25# 3x 10 with min cuing for eccentric control with good carry over - Small body blade flex 3x 10sec; abd 3x 10 sec with demo and max cuing needed initially for technique with good carry over following; mostly to prevent excessive protraction                      PT Education - 05/02/19 1653    Education Details  therex form    Person(s) Educated  Patient    Methods  Explanation;Demonstration;Verbal cues    Comprehension  Verbalized understanding;Returned demonstration;Verbal cues required  PT Short Term Goals - 04/03/19 1100      PT SHORT TERM GOAL #1   Title  Pt will be independent with HEP in order to improve strength and decrease pain in order to improve pain-free function at home and work.    Baseline  04/03/19    Time  4    Period  Weeks    Status  New      PT SHORT TERM GOAL #2   Title  Patient will demonstrate full passive ROM to demonstrate full ROM of joint needed to build AROM and strength    Baseline  04/03/19 shoulder flex 120 abd 121 ER 65 IR 60    Time  4    Period  Weeks    Status  New        PT Long Term Goals - 04/03/19 1051      PT LONG TERM GOAL #1   Title  Pt will decrease quick DASH score by at least 8% in order to demonstrate  clinically significant reduction in disability.    Baseline  04/03/19 79.5%    Time  8    Period  Weeks    Status  New      PT LONG TERM GOAL #2   Title  Pt will decrease worst pain as reported on NPRS by at least 3 points in order to demonstrate clinically significant reduction in pain.    Baseline  04/03/19 8/10 with overhead motion    Time  8    Period  Weeks    Status  New      PT LONG TERM GOAL #3   Title  Patient will demonstrate gross 5/5 R shoulder strength and 4+/5 periscapular strength in order to demonstrate symmetrical strength, and strength needed for overhead motion    Baseline  04/03/19 see eval    Time  8    Period  Weeks    Status  New      PT LONG TERM GOAL #4   Title  [name] will demonstrate full R shoulder AROM in order to complete ADLs (bathing, cleaning)    Baseline  04/03/19 R shoulder flex 119d abd 76d ER C5 IR T12    Time  8    Period  Weeks            Plan - 05/02/19 1725    Clinical Impression Statement  PT continued therex progressoin with compound motion strengthening. Patient is able to complete all therex with proper technique following demo and cuing. PT will continue progression as able.    Personal Factors and Comorbidities  Age;Behavior Pattern;Fitness;Past/Current Experience;Profession    Examination-Activity Limitations  Bathing;Dressing;Hygiene/Grooming;Lift;Carry;Reach Overhead    Examination-Participation Restrictions  Cleaning;Community Activity;Meal Prep;Other    Stability/Clinical Decision Making  Evolving/Moderate complexity    Clinical Decision Making  Moderate    Rehab Potential  Good    PT Frequency  2x / week    PT Duration  8 weeks    PT Treatment/Interventions  Spinal Manipulations;Joint Manipulations;Dry needling;Moist Heat;Traction;Iontophoresis 4mg /ml Dexamethasone;Therapeutic exercise;Patient/family education;Manual techniques;Passive range of motion;Neuromuscular re-education;Functional mobility training;Therapeutic  activities;Electrical Stimulation;ADLs/Self Care Home Management;Cryotherapy;Ultrasound    PT Next Visit Plan  strengthening    PT Home Exercise Plan  UT stretch, doorway pec/bicep stretch, flex table slides, thoracic ext    Consulted and Agree with Plan of Care  Patient       Patient will benefit from skilled therapeutic intervention in order to improve the following deficits and impairments:  Increased muscle spasms, Decreased mobility, Decreased endurance, Decreased range of motion, Impaired tone, Improper body mechanics, Pain, Postural dysfunction, Impaired UE functional use, Impaired flexibility, Increased fascial restricitons, Decreased strength, Decreased coordination, Decreased activity tolerance  Visit Diagnosis: Stiffness of right shoulder, not elsewhere classified  Acute pain of right shoulder     Problem List There are no problems to display for this patient.  Staci Acosta PT, DPT Staci Acosta 05/02/2019, 5:27 PM  [facility] PHYSICAL AND SPORTS MEDICINE [address] [place], [address] Phone: [phone]   Fax:  [phone]  Name: Stacy Curry MRN: [id number] Date of Birth: 30-Apr-1971

## 2019-05-07 ENCOUNTER — Other Ambulatory Visit: Payer: Self-pay

## 2019-05-07 ENCOUNTER — Ambulatory Visit: Payer: PRIVATE HEALTH INSURANCE | Admitting: Physical Therapy

## 2019-05-07 ENCOUNTER — Encounter: Payer: Self-pay | Admitting: Physical Therapy

## 2019-05-07 DIAGNOSIS — M25611 Stiffness of right shoulder, not elsewhere classified: Secondary | ICD-10-CM

## 2019-05-07 DIAGNOSIS — M25511 Pain in right shoulder: Secondary | ICD-10-CM

## 2019-05-07 NOTE — Therapy (Signed)
[facility] PHYSICAL AND SPORTS MEDICINE [address] [place], [address] Phone: [phone]   Fax:  [phone]  Physical Therapy Treatment  Patient Details  Name: Yahayra Martinique MRN: [id number] Date of Birth: December 13, 1970 No data recorded  Encounter Date: 05/07/2019  PT End of Session - 05/07/19 1430    Visit Number  9    Number of Visits  17    Date for PT Re-Evaluation  05/28/19    PT Start Time  0228    PT Stop Time  0308    PT Time Calculation (min)  40 min    Activity Tolerance  Patient tolerated treatment well    Behavior During Therapy  [facility] for tasks assessed/performed       History reviewed. No pertinent past medical history.  Past Surgical History:  Procedure Laterality Date  . TUBAL LIGATION      There were no vitals filed for this visit.  Subjective Assessment - 05/07/19 1428    Subjective  Reports no shoulder pain today, motion is continuing to improve. Thinks she will be able to return to work after next week.    Pertinent History  Pt is a 48 year old female presenting with R shoulder pain since 03/20/19 following lifting a heavy trash bag. Reports she lifted with bilat UEs and immediately heard and felt her shoulder pop. Says immediately her shoulder "stiffened up"  and she has not been able to move her shoulder through a full ROM since. [name] is on light duty and is not able to complete overhead reaching or heavy lifting. Reports a constant burning sensation on back side of her shoulder blade that is constant, and reports stiffness in the shoulder that can become sharp with overhead motions. Worst pain over the past week 8/10 best 0/10. 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 Has difficult at home lifting her grandson (53year old), completing household chores, and bathing Pt denies N/V, B&B changes, unexplained weight  fluctuation, saddle paresthesia, fever, night sweats, or unrelenting night pain at this time.    Limitations  Lifting;House hold activities    How long can you sit comfortably?  unlimited    How long can you stand comfortably?  unlimited    How long can you walk comfortably?  unlimited    Diagnostic tests  None    Patient Stated Goals  Return to full work duties    Pain Onset  1 to 4 weeks ago       Ther-Ex -UBE L1 5min FWD 48min backward AROM assessment with some remaining difficulty and pain with IR; all other motions pain free WNL - Cable IR 5# 3 x10; with min cuing initially for set up with good carry over - Upright row25#3x 10with good carry over of proper technique - Curl to overhead press 5# DB bilat 3x 10with demo and min cuing initially for proper technique with good carry over - Omega overhead press 25# 3x 10 with min cuing for eccentric control with good carry over - Push up position on 2.77ft mat table fwd/lateral scap taps 3x 10 each side with demo and cuing initially for proper technique, good carry over - Farmers carry 2x 40ft 20# KB with min cuing initially for scapular stabilization with good carry over  PT Education - 05/07/19 1429    Education Details  therex form    Person(s) Educated  Patient    Methods  Explanation;Demonstration;Verbal cues    Comprehension  Verbalized understanding;Returned demonstration;Verbal cues required       PT Short Term Goals - 04/03/19 1100      PT SHORT TERM GOAL #1   Title  Pt will be independent with HEP in order to improve strength and decrease pain in order to improve pain-free function at home and work.    Baseline  04/03/19    Time  4    Period  Weeks    Status  New      PT SHORT TERM GOAL #2   Title  Patient will demonstrate full passive ROM to demonstrate full ROM of joint needed to build AROM and strength    Baseline  04/03/19 shoulder flex 120 abd 121 ER 65 IR 60    Time   4    Period  Weeks    Status  New        PT Long Term Goals - 04/03/19 1051      PT LONG TERM GOAL #1   Title  Pt will decrease quick DASH score by at least 8% in order to demonstrate clinically significant reduction in disability.    Baseline  04/03/19 79.5%    Time  8    Period  Weeks    Status  New      PT LONG TERM GOAL #2   Title  Pt will decrease worst pain as reported on NPRS by at least 3 points in order to demonstrate clinically significant reduction in pain.    Baseline  04/03/19 8/10 with overhead motion    Time  8    Period  Weeks    Status  New      PT LONG TERM GOAL #3   Title  Patient will demonstrate gross 5/5 R shoulder strength and 4+/5 periscapular strength in order to demonstrate symmetrical strength, and strength needed for overhead motion    Baseline  04/03/19 see eval    Time  8    Period  Weeks    Status  New      PT LONG TERM GOAL #4   Title  [name] will demonstrate full R shoulder AROM in order to complete ADLs (bathing, cleaning)    Baseline  04/03/19 R shoulder flex 119d abd 76d ER C5 IR T12    Time  8    Period  Weeks            Plan - 05/07/19 1434    Clinical Impression Statement  PT continued therex progression for increased shoulder strength in functional capacity with good success. Patient able to complete all therex with proper technique, no pincreased pain, following cuing and demo. PT will assess d/c readiness to HEP next visit.    Personal Factors and Comorbidities  Age;Behavior Pattern;Fitness;Past/Current Experience;Profession    Examination-Activity Limitations  Bathing;Dressing;Hygiene/Grooming;Lift;Carry;Reach Overhead    Examination-Participation Restrictions  Cleaning;Community Activity;Meal Prep;Other    Stability/Clinical Decision Making  Evolving/Moderate complexity    Clinical Decision Making  Moderate    Rehab Potential  Good    PT Frequency  2x / week    PT Duration  8 weeks    PT Treatment/Interventions  Spinal  Manipulations;Joint Manipulations;Dry needling;Moist Heat;Traction;Iontophoresis 4mg /ml Dexamethasone;Therapeutic exercise;Patient/family education;Manual techniques;Passive range of motion;Neuromuscular re-education;Functional mobility training;Therapeutic activities;Electrical Stimulation;ADLs/Self Care Home Management;Cryotherapy;Ultrasound    PT Next Visit Plan  strengthening    PT Home Exercise Plan  UT stretch, doorway pec/bicep stretch, flex table slides, thoracic ext    Consulted and Agree with Plan of Care  Patient       Patient will benefit from skilled therapeutic intervention in order to improve the following deficits and impairments:  Increased muscle spasms, Decreased mobility, Decreased endurance, Decreased range of motion, Impaired tone, Improper body mechanics, Pain, Postural dysfunction, Impaired UE functional use, Impaired flexibility, Increased fascial restricitons, Decreased strength, Decreased coordination, Decreased activity tolerance  Visit Diagnosis: Stiffness of right shoulder, not elsewhere classified  Acute pain of right shoulder     Problem List There are no problems to display for this patient.  Staci Acostahelsea Miller PT, DPT Staci Acostahelsea Miller 05/07/2019, 3:07 PM  [facility] [address] [place], [place], [address] Phone: [phone]   Fax:  [phone]  Name: Charita SwazilandJordan MRN: [id number] Date of Birth: 01-18-1971

## 2019-05-14 ENCOUNTER — Other Ambulatory Visit: Payer: Self-pay

## 2019-05-14 ENCOUNTER — Ambulatory Visit: Payer: PRIVATE HEALTH INSURANCE | Admitting: Physical Therapy

## 2019-05-14 ENCOUNTER — Encounter: Payer: Self-pay | Admitting: Physical Therapy

## 2019-05-14 DIAGNOSIS — M25511 Pain in right shoulder: Secondary | ICD-10-CM

## 2019-05-14 DIAGNOSIS — M25611 Stiffness of right shoulder, not elsewhere classified: Secondary | ICD-10-CM

## 2019-05-14 NOTE — Therapy (Signed)
[facility] PHYSICAL AND SPORTS MEDICINE [address] [place], [address] Phone: [phone]   Fax:  [phone]  Physical Therapy Treatment/DC Summary Reporting Period 03/28/19 - 05/14/19  Patient Details  Name: Stacy Curry MRN: [id number] Date of Birth: [birth year] No data recorded  Encounter Date: 05/14/2019  PT End of Session - 05/14/19 1032    Visit Number  10    Number of Visits  17    Date for PT Re-Evaluation  05/28/19    PT Start Time  1030    PT Stop Time  1100    PT Time Calculation (min)  30 min    Activity Tolerance  Patient tolerated treatment well    Behavior During Therapy  [facility] for tasks assessed/performed       History reviewed. No pertinent past medical history.  Past Surgical History:  Procedure Laterality Date  . TUBAL LIGATION      There were no vitals filed for this visit.  Subjective Assessment - 05/14/19 1031    Subjective  Patient reports no shoulder pain today, motion is continuing to do well.    Pertinent History  Pt is a 48 year old female presenting with R shoulder pain since 03/20/19 following lifting a heavy trash bag. Reports she lifted with bilat UEs and immediately heard and felt her shoulder pop. Says immediately her shoulder "stiffened up"  and she has not been able to move her shoulder through a full ROM since. [name] is on light duty and is not able to complete overhead reaching or heavy lifting. Reports a constant burning sensation on back side of her shoulder blade that is constant, and reports stiffness in the shoulder that can become sharp with overhead motions. Worst pain over the past week 8/10 best 0/10. 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 Has difficult at home lifting her grandson (2year old), completing household chores, and bathing Pt denies N/V, B&B changes, unexplained weight  fluctuation, saddle paresthesia, fever, night sweats, or unrelenting night pain at this time.    Limitations  Lifting;House hold activities    How long can you sit comfortably?  unlimited    How long can you stand comfortably?  unlimited    How long can you walk comfortably?  unlimited    Diagnostic tests  None    Patient Stated Goals  Return to full work duties         Ther-Ex -UBE L5mn FWD231m backward AROM and MMT assessment with all motions pain free and shoulder strength 5/5 and periscapular strength   PT reviewed the following therex with patient with patient able to complete a set of all therex with min cuing needed for correction and excellent carry over. PT educated patient on frequency, rep/set range, needed for strength maintenance with verbalized understanding Exercises  Standing Upright Shoulder Row with Barbell - 5-10 reps - 3 sets - 1x daily - 2-3x weekly  Standing Bent-Over Shoulder Row with Barbell - 5-10 reps - 3 sets - 1x daily - 2-3x weekly  Seated Overhead Press - 5-10 reps - 3 sets - 1x daily - 2-3x weekly  Standing Row with Anchored Resistance - 10 reps - 3 sets - 1x daily - 2-3x weekly  Standing High Row with Resistance - 10 reps - 3 sets - 1x daily - 2-3x weekly  Prone Scapular Retraction Y - 10 reps - 3 sets - 1x daily -  2-3x weekly  Prone Shoulder Horizontal Abduction with Thumbs Up - 10 reps - 3 sets - 1x daily - 2-3x weekly   [id number]                         PT Education - 05/14/19 1040    Education Details  d/c recommendations, HEP update    Person(s) Educated  Patient    Methods  Explanation;Demonstration;Tactile cues;Verbal cues;Handout    Comprehension  Verbalized understanding;Returned demonstration;Verbal cues required;Tactile cues required       PT Short Term Goals - 05/14/19 1033      PT SHORT TERM GOAL #1   Title  Pt will be independent with HEP in order to improve strength and decrease pain in order to improve  pain-free function at home and work.    Baseline  05/14/19 Completing HEP without complaint    Time  4    Period  Weeks    Status  Achieved      PT SHORT TERM GOAL #2   Title  Patient will demonstrate full passive ROM to demonstrate full ROM of joint needed to build AROM and strength    Baseline  05/14/19 Full passive ROM    Time  4    Period  Weeks    Status  Achieved        PT Long Term Goals - 05/14/19 1033      PT LONG TERM GOAL #1   Title  Pt will decrease quick DASH score by at least 8% in order to demonstrate clinically significant reduction in disability.    Baseline  05/14/19 22.7%    Time  8    Period  Weeks    Status  Achieved      PT LONG TERM GOAL #2   Title  Pt will decrease worst pain as reported on NPRS by at least 3 points in order to demonstrate clinically significant reduction in pain.    Baseline  05/14/19 No pain with ADLs    Time  8    Period  Weeks    Status  Achieved      PT LONG TERM GOAL #3   Title  Patient will demonstrate gross 5/5 R shoulder strength and 4+/5 periscapular strength in order to demonstrate symmetrical strength, and strength needed for overhead motion    Baseline  05/14/19 5/5 gross shoulder strength; periscapular strength Y 4+ bilat; T/I/latissimus 5/5 bilat    Time  8    Period  Weeks    Status  Achieved      PT LONG TERM GOAL #4   Title  [name] will demonstrate full R shoulder AROM in order to complete ADLs (bathing, cleaning)    Baseline  12.29/20 Full R shoulder AROM symmetrical to LUE without pain    Time  8    Period  Weeks    Status  Achieved            Plan - 05/14/19 1052    Clinical Impression Statement  PT reassessed patinet goals this visit. Patient has met all goals to safely d/c PT and demonstrates and verbalizes understanding of all HEP recommendations to maintain strength. Patient given clinic contact info should any further questions/concerns arise.    Personal Factors and Comorbidities  Age;Behavior  Pattern;Fitness;Past/Current Experience;Profession    Examination-Activity Limitations  Bathing;Dressing;Hygiene/Grooming;Lift;Carry;Reach Overhead    Examination-Participation Restrictions  Cleaning;Community Activity;Meal Prep;Other    Stability/Clinical Decision Making  Evolving/Moderate complexity  Clinical Decision Making  Moderate    Rehab Potential  Good    PT Frequency  2x / week    PT Duration  8 weeks    PT Treatment/Interventions  Spinal Manipulations;Joint Manipulations;Dry needling;Moist Heat;Traction;Iontophoresis 67m/ml Dexamethasone;Therapeutic exercise;Patient/family education;Manual techniques;Passive range of motion;Neuromuscular re-education;Functional mobility training;Therapeutic activities;Electrical Stimulation;ADLs/Self Care Home Management;Cryotherapy;Ultrasound    PT Home Exercise Plan  [id number]    Consulted and Agree with Plan of Care  Patient       Patient will benefit from skilled therapeutic intervention in order to improve the following deficits and impairments:  Increased muscle spasms, Decreased mobility, Decreased endurance, Decreased range of motion, Impaired tone, Improper body mechanics, Pain, Postural dysfunction, Impaired UE functional use, Impaired flexibility, Increased fascial restricitons, Decreased strength, Decreased coordination, Decreased activity tolerance  Visit Diagnosis: Stiffness of right shoulder, not elsewhere classified  Acute pain of right shoulder     Problem List There are no problems to display for this patient.  CShelton SilvasPT, DPT CShelton Silvas12/29/2020, 11:02 AM  [facility] [address] [place] [address]Phone: [phone]  Fax:  [phone] Name: Stacy JMartiniqueMRN: [id number] of Birth: [birth year]

## 2019-05-21 ENCOUNTER — Encounter: Payer: Self-pay | Admitting: Physical Therapy

## 2019-10-25 ENCOUNTER — Other Ambulatory Visit: Payer: Self-pay

## 2019-10-25 ENCOUNTER — Encounter: Payer: Self-pay | Admitting: Emergency Medicine

## 2019-10-25 ENCOUNTER — Ambulatory Visit
Admission: EM | Admit: 2019-10-25 | Discharge: 2019-10-25 | Disposition: A | Discharge status: Home / Self Care | Payer: Self-pay | Attending: Family Medicine | Admitting: Family Medicine

## 2019-10-25 DIAGNOSIS — S29019A Strain of muscle and tendon of unspecified wall of thorax, initial encounter: Secondary | ICD-10-CM | POA: Insufficient documentation

## 2019-10-25 DIAGNOSIS — R030 Elevated blood-pressure reading, without diagnosis of hypertension: Secondary | ICD-10-CM | POA: Insufficient documentation

## 2019-10-25 LAB — URINALYSIS, COMPLETE (UACMP) WITH MICROSCOPIC
Bacteria, UA: NONE SEEN
Bilirubin Urine: NEGATIVE
Glucose, UA: NEGATIVE mg/dL
Ketones, ur: NEGATIVE mg/dL
Leukocytes,Ua: NEGATIVE
Nitrite: NEGATIVE
Protein, ur: NEGATIVE mg/dL
Specific Gravity, Urine: 1.015 (ref 1.005–1.030)
WBC, UA: NONE SEEN WBC/hpf (ref 0–5)
pH: 5.5 (ref 5.0–8.0)

## 2019-10-25 MED ORDER — CYCLOBENZAPRINE HCL 10 MG PO TABS: 10.0000 mg | ORAL_TABLET | Freq: Three times a day (TID) | ORAL | 0 refills | Status: DC | PRN

## 2019-10-25 NOTE — ED Provider Notes (Signed)
[facility]    CSN: [id number] Arrival date & time: 10/25/19  1156      History   Chief Complaint Chief Complaint  Patient presents with  . Back Pain  . Hypertension    HPI Stacy Curry is a 49 y.o. female.   49 yo female with a c/o left mid back pain since this morning. States she woke up with the pain. Denies any fevers, chills, chest pains, shortness of breath, urinary symptoms, injuries, rash.    Also states she's noticed her blood pressures running a little bit high.    Back Pain Hypertension    History reviewed. No pertinent past medical history.  There are no problems to display for this patient.   Past Surgical History:  Procedure Laterality Date  . TUBAL LIGATION      OB History   No obstetric history on file.      Home Medications    Prior to Admission medications   Medication Sig Start Date End Date Taking? Authorizing Provider  amoxicillin-clavulanate (AUGMENTIN) 875-125 MG tablet Take 1 tablet by mouth every 12 (twelve) hours. 12/16/18   Lutricia Feil, PA-C  cyclobenzaprine (FLEXERIL) 10 MG tablet Take 1 tablet (10 mg total) by mouth 3 (three) times daily as needed for muscle spasms. 10/25/19   Payton Mccallum, MD  HYDROcodone-acetaminophen (NORCO/VICODIN) 5-325 MG tablet Take 1-2 tablets by mouth every 6 (six) hours as needed. 12/16/18   Lutricia Feil, PA-C    Family History History reviewed. No pertinent family history.  Social History Social History   Tobacco Use  . Smoking status: Current Every Day Smoker  . Smokeless tobacco: Never Used  Vaping Use  . Vaping Use: Never used  Substance Use Topics  . Alcohol use: No  . Drug use: No     Allergies   Penicillins and Codeine   Review of Systems Review of Systems  Musculoskeletal: Positive for back pain.     Physical Exam Triage Vital Signs ED Triage Vitals  Enc Vitals Group     BP 10/25/19 1213 (!) 131/98     Pulse Rate 10/25/19 1213 80     Resp  10/25/19 1213 14     Temp 10/25/19 1213 97.9 F (36.6 C)     Temp Source 10/25/19 1213 Oral     SpO2 10/25/19 1213 97 %     Weight 10/25/19 1210 168 lb (76.2 kg)     Height 10/25/19 1210 5\' 4"  (1.626 m)     Head Circumference --      Peak Flow --      Pain Score 10/25/19 1209 8     Pain Loc --      Pain Edu? --      Excl. in [place]? --    No data found.  Updated Vital Signs BP (!) 131/98 (BP Location: Left Arm)   Pulse 80   Temp 97.9 F (36.6 C) (Oral)   Resp 14   Ht 5\' 4"  (1.626 m)   Wt 76.2 kg   SpO2 97%   BMI 28.84 kg/m   Visual Acuity Right Eye Distance:   Left Eye Distance:   Bilateral Distance:    Right Eye Near:   Left Eye Near:    Bilateral Near:     Physical Exam Vitals and nursing note reviewed.  Constitutional:      General: She is not in acute distress.    Appearance: She is not toxic-appearing or diaphoretic.  Cardiovascular:  Rate and Rhythm: Normal rate.     Heart sounds: Normal heart sounds.  Pulmonary:     Effort: Pulmonary effort is normal. No respiratory distress.     Breath sounds: Normal breath sounds.  Abdominal:     Palpations: Abdomen is soft.     Tenderness: There is no right CVA tenderness or left CVA tenderness.  Musculoskeletal:     Thoracic back: Spasms and tenderness present. No swelling, edema, deformity, signs of trauma, lacerations or bony tenderness. Normal range of motion. No scoliosis.  Neurological:     Mental Status: She is alert.      UC Treatments / Results  Labs (all labs ordered are listed, but only abnormal results are displayed) Labs Reviewed  URINALYSIS, COMPLETE (UACMP) WITH MICROSCOPIC - Abnormal; Notable for the following components:      Result Value   Hgb urine dipstick TRACE (*)    All other components within normal limits    EKG   Radiology No results found.  Procedures Procedures (including critical care time)  Medications Ordered in UC Medications - No data to display  Initial  Impression / Assessment and Plan / UC Course  I have reviewed the triage vital signs and the nursing notes.  Pertinent labs & imaging results that were available during my care of the patient were reviewed by me and considered in my medical decision making (see chart for details).     Final Clinical Impressions(s) / UC Diagnoses   Final diagnoses:  Thoracic myofascial strain, initial encounter  Elevated blood pressure reading     Discharge Instructions     Rest, over the counter advil/aleve/tylenol Follow up with primary care provider    ED Prescriptions    Medication Sig Dispense Auth. Provider   cyclobenzaprine (FLEXERIL) 10 MG tablet Take 1 tablet (10 mg total) by mouth 3 (three) times daily as needed for muscle spasms. 30 tablet Norval Gable, MD      1. Lab results and diagnosis reviewed with patient 2. rx as per orders above; reviewed possible side effects, interactions, risks and benefits  3. Recommend supportive treatment as above 4. Follow-up prn if symptoms worsen or don't improve  PDMP not reviewed this encounter.   Norval Gable, MD 10/25/19 1325

## 2019-10-25 NOTE — ED Triage Notes (Signed)
Patient c/o lower left sided back pain that started this morning.  Patient also reports elevated blood pressure over this week.  Patient denies any urinary symptoms.

## 2019-10-25 NOTE — Discharge Instructions (Addendum)
Rest, over the counter advil/aleve/tylenol Follow up with primary care provider

## 2019-10-29 ENCOUNTER — Other Ambulatory Visit: Payer: Self-pay

## 2019-10-29 ENCOUNTER — Encounter: Payer: Self-pay | Admitting: Medical Oncology

## 2019-10-29 ENCOUNTER — Emergency Department
Admission: EM | Admit: 2019-10-29 | Discharge: 2019-10-29 | Disposition: A | Discharge status: Home / Self Care | Payer: Self-pay | Attending: Emergency Medicine | Admitting: Emergency Medicine

## 2019-10-29 ENCOUNTER — Emergency Department: Payer: Self-pay

## 2019-10-29 DIAGNOSIS — R1013 Epigastric pain: Secondary | ICD-10-CM | POA: Insufficient documentation

## 2019-10-29 DIAGNOSIS — F17210 Nicotine dependence, cigarettes, uncomplicated: Secondary | ICD-10-CM | POA: Insufficient documentation

## 2019-10-29 DIAGNOSIS — Z79899 Other long term (current) drug therapy: Secondary | ICD-10-CM | POA: Insufficient documentation

## 2019-10-29 DIAGNOSIS — R109 Unspecified abdominal pain: Secondary | ICD-10-CM

## 2019-10-29 LAB — COMPREHENSIVE METABOLIC PANEL
ALT: 14 U/L (ref 0–44)
AST: 19 U/L (ref 15–41)
Albumin: 4.2 g/dL (ref 3.5–5.0)
Alkaline Phosphatase: 75 U/L (ref 38–126)
Anion gap: 7 (ref 5–15)
BUN: 12 mg/dL (ref 6–20)
CO2: 25 mmol/L (ref 22–32)
Calcium: 9.5 mg/dL (ref 8.9–10.3)
Chloride: 106 mmol/L (ref 98–111)
Creatinine, Ser: 0.73 mg/dL (ref 0.44–1.00)
GFR calc Af Amer: >60 mL/min (ref >60)
GFR calc non Af Amer: >60 mL/min (ref >60)
Glucose, Bld: 97 mg/dL (ref 70–99)
Potassium: 4.2 mmol/L (ref 3.5–5.1)
Sodium: 138 mmol/L (ref 135–145)
Total Bilirubin: 0.5 mg/dL (ref 0.3–1.2)
Total Protein: 7.4 g/dL (ref 6.5–8.1)

## 2019-10-29 LAB — LIPASE, BLOOD: Lipase: 27 U/L (ref 11–51)

## 2019-10-29 LAB — URINALYSIS, COMPLETE (UACMP) WITH MICROSCOPIC
Bilirubin Urine: NEGATIVE
Glucose, UA: NEGATIVE mg/dL
Ketones, ur: NEGATIVE mg/dL
Leukocytes,Ua: NEGATIVE
Nitrite: POSITIVE — AB
Protein, ur: NEGATIVE mg/dL
Specific Gravity, Urine: 1.017 (ref 1.005–1.030)
pH: 5 (ref 5.0–8.0)

## 2019-10-29 LAB — CBC
HCT: 41.8 % (ref 36.0–46.0)
Hemoglobin: 14.2 g/dL (ref 12.0–15.0)
MCH: 31.2 pg (ref 26.0–34.0)
MCHC: 34 g/dL (ref 30.0–36.0)
MCV: 91.9 fL (ref 80.0–100.0)
Platelets: 278 10*3/uL (ref 150–400)
RBC: 4.55 MIL/uL (ref 3.87–5.11)
RDW: 13.4 % (ref 11.5–15.5)
WBC: 7 10*3/uL (ref 4.0–10.5)
nRBC: 0 % (ref 0.0–0.2)

## 2019-10-29 MED ORDER — NAPROXEN 375 MG PO TABS
375.0000 mg | ORAL_TABLET | Freq: Two times a day (BID) | ORAL | 0 refills | Status: AC
Start: 2019-10-29 — End: 2019-11-05

## 2019-10-29 MED ORDER — CEPHALEXIN 500 MG PO CAPS: 500.0000 mg | ORAL_CAPSULE | Freq: Three times a day (TID) | ORAL | 0 refills | Status: AC

## 2019-10-29 MED ORDER — KETOROLAC TROMETHAMINE 60 MG/2ML IM SOLN
60.0000 mg | Freq: Once | INTRAMUSCULAR | Status: AC
  Administered 2019-10-29: 60 mg via INTRAMUSCULAR
  Filled 2019-10-29: qty 2

## 2019-10-29 MED ORDER — HYDROCODONE-ACETAMINOPHEN 5-325 MG PO TABS: 1.0000 | ORAL_TABLET | Freq: Four times a day (QID) | ORAL | 0 refills | Status: DC | PRN

## 2019-10-29 NOTE — Discharge Instructions (Signed)
As we discussed, I suspect your symptoms are due to muscular injury.  I have given you a steroid for this and prescribed naproxen which you should take twice a day with meals for the next week.  Take this even if your pain is improving, to help with inflammation.  I have also prescribed something stronger for pain.  Try to minimize lifting anything more than 15 pounds for the next week.  I written a note for this.  Minimize twisting and lifting.

## 2019-10-29 NOTE — ED Notes (Signed)
Rainbow sent to the lab.  

## 2019-10-29 NOTE — ED Notes (Signed)
AAOx3.  Skin warm and dry. NaD

## 2019-10-29 NOTE — ED Triage Notes (Signed)
Pt reports that she has been having left sided flank pain Thursday, the pain has now began to wrap around her upper abdomen. Pt denies any other sx's.

## 2019-10-29 NOTE — ED Provider Notes (Signed)
[facility] Emergency Department Provider Note  ____________________________________________   First MD Initiated Contact with Patient 10/29/19 1328     (approximate)  I have reviewed the triage vital signs and the nursing notes.   HISTORY  Chief Complaint Abdominal Pain    HPI Stacy Curry is a 49 y.o. female here with left and now epigastric abdominal pain.  The patient states that her symptoms started several days ago.  She states that she experienced gradual onset of progressively worsening left flank pain that felt like she had pulled something several days ago.  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  She states that she went to urgent care and was diagnosed with possible muscular injury, and since then has had some improvement in her pain, but when she returned to work today, the pain returned.  It is now a sensation of sharp, stabbing, positional, left as well as right flank pain radiating around towards her epigastric area.  No nausea or vomiting.  No cough or shortness of breath.  No other associated symptoms.  The pain is worse with movement and certain positions.  No alleviating factors.        History reviewed. No pertinent past medical history.  There are no problems to display for this patient.   Past Surgical History:  Procedure Laterality Date  . TUBAL LIGATION      Prior to Admission medications   Medication Sig Start Date End Date Taking? Authorizing Provider  amoxicillin-clavulanate (AUGMENTIN) 875-125 MG tablet Take 1 tablet by mouth every 12 (twelve) hours. 12/16/18   Lutricia Feil, PA-C  cephALEXin (KEFLEX) 500 MG capsule Take 1 capsule (500 mg total) by mouth 3 (three) times daily for 7 days. 10/29/19 11/05/19  Shaune Pollack, MD  cyclobenzaprine (FLEXERIL) 10 MG tablet Take 1 tablet (10 mg total) by mouth 3 (three) times daily as needed for muscle spasms. 10/25/19   Payton Mccallum,  MD  HYDROcodone-acetaminophen (NORCO/VICODIN) 5-325 MG tablet Take 1-2 tablets by mouth every 6 (six) hours as needed for moderate pain or severe pain. 10/29/19 10/28/20  Shaune Pollack, MD  naproxen (NAPROSYN) 375 MG tablet Take 1 tablet (375 mg total) by mouth 2 (two) times daily with a meal for 7 days. 10/29/19 11/05/19  Shaune Pollack, MD    Allergies Penicillins and Codeine  No family history on file.  Social History Social History   Tobacco Use  . Smoking status: Current Every Day Smoker  . Smokeless tobacco: Never Used  Vaping Use  . Vaping Use: Never used  Substance Use Topics  . Alcohol use: No  . Drug use: No    Review of Systems  Review of Systems  Constitutional: Negative for fatigue and fever.  HENT: Negative for congestion and sore throat.   Eyes: Negative for visual disturbance.  Respiratory: Negative for cough and shortness of breath.   Cardiovascular: Positive for chest pain.  Gastrointestinal: Negative for abdominal pain, diarrhea, nausea and vomiting.  Genitourinary: Positive for flank pain.  Musculoskeletal: Positive for arthralgias. Negative for back pain and neck pain.  Skin: Negative for rash and wound.  Neurological: Negative for weakness.  All other systems reviewed and are negative.    ____________________________________________  PHYSICAL EXAM:      VITAL SIGNS: ED Triage Vitals [10/29/19 0914]  Enc Vitals Group     BP (!) 139/97     Pulse Rate 91     Resp 16  Temp 98.4 F (36.9 C)     Temp Source Oral     SpO2 99 %     Weight 167 lb 8.8 oz (76 kg)     Height 5\' 4"  (1.626 m)     Head Circumference      Peak Flow      Pain Score 9     Pain Loc      Pain Edu?      Excl. in [place]?      Physical Exam Vitals and nursing note reviewed.  Constitutional:      General: She is not in acute distress.    Appearance: She is well-developed.  HENT:     Head: Normocephalic and atraumatic.  Eyes:     Conjunctiva/sclera: Conjunctivae  normal.  Cardiovascular:     Rate and Rhythm: Normal rate and regular rhythm.     Heart sounds: Normal heart sounds.  Pulmonary:     Effort: Pulmonary effort is normal. No respiratory distress.     Breath sounds: No wheezing.  Chest:     Comments: Tenderness to palpation along the left lower intercostal and right intercostal spaces, radiating around towards her back with movement.  No focal abdominal tenderness.  No specific bony deformity. Abdominal:     General: There is no distension.     Comments: No upper quadrant or left upper quadrant tenderness.  No rebound or guarding.  Musculoskeletal:     Cervical back: Neck supple.  Skin:    General: Skin is warm.     Capillary Refill: Capillary refill takes less than 2 seconds.     Findings: No rash.  Neurological:     Mental Status: She is alert and oriented to person, place, and time.     Motor: No abnormal muscle tone.       ____________________________________________   LABS (all labs ordered are listed, but only abnormal results are displayed)  Labs Reviewed  URINALYSIS, COMPLETE (UACMP) WITH MICROSCOPIC - Abnormal; Notable for the following components:      Result Value   Color, Urine YELLOW (*)    APPearance HAZY (*)    Hgb urine dipstick SMALL (*)    Nitrite POSITIVE (*)    Bacteria, UA RARE (*)    All other components within normal limits  LIPASE, BLOOD  COMPREHENSIVE METABOLIC PANEL  CBC    ____________________________________________  EKG: Normal sinus rhythm, ventricular rate 90.  PR 156, QRS 66, QTc 413.  No acute ST elevations or depressions. ________________________________________  RADIOLOGY All imaging, including plain films, CT scans, and ultrasounds, independently reviewed by me, and interpretations confirmed via formal radiology reads.  ED MD interpretation:   Chest x-ray: Bronchitis, clear CT stone: No UTI, no acute abnormality  Official radiology report(s): DG Chest 2 View  Result Date:  10/29/2019 CLINICAL DATA:  Left-sided chest and flank pain. EXAM: CHEST - 2 VIEW COMPARISON:  11/14/2017 FINDINGS: Midline trachea. Normal heart size and mediastinal contours. No pleural effusion or pneumothorax. Diffuse peribronchial thickening. IMPRESSION: 1. No acute cardiopulmonary disease. 2. Mild peribronchial thickening which may relate to chronic bronchitis or smoking. Electronically Signed   By: Abigail Miyamoto M.D.   On: 10/29/2019 14:36   CT Renal Stone Study  Result Date: 10/29/2019 CLINICAL DATA:  Left-sided flank pain since Thursday, pain radiating to upper abdomen EXAM: CT ABDOMEN AND PELVIS WITHOUT CONTRAST TECHNIQUE: Multidetector CT imaging of the abdomen and pelvis was performed following the standard protocol without IV contrast. COMPARISON:  None. FINDINGS:  Lower chest: No acute pleural or parenchymal lung disease. Hepatobiliary: No focal liver abnormality is seen. No gallstones, gallbladder wall thickening, or biliary dilatation. Pancreas: Unremarkable. No pancreatic ductal dilatation or surrounding inflammatory changes. Spleen: Normal in size without focal abnormality. Adrenals/Urinary Tract: No urinary tract calculi or obstructive uropathy. The adrenals are normal. Bladder is unremarkable. Stomach/Bowel: No bowel obstruction or ileus. Normal appendix right lower quadrant. No bowel wall thickening or inflammatory change. Vascular/Lymphatic: Aortic atherosclerosis. No enlarged abdominal or pelvic lymph nodes. Reproductive: Uterus and bilateral adnexa are unremarkable. Other: No abdominal wall hernia or abnormality. No abdominopelvic ascites. Musculoskeletal: No acute or destructive bony lesions. Reconstructed images demonstrate no additional findings. IMPRESSION: 1. No urinary tract calculi or obstructive uropathy. 2. No acute intra-abdominal or intrapelvic process. 3.  Aortic Atherosclerosis (ICD10-I70.0). Electronically Signed   By: Sharlet Salina M.D.   On: 10/29/2019 15:04     ____________________________________________  PROCEDURES   Procedure(s) performed (including Critical Care):  Procedures  ____________________________________________  INITIAL IMPRESSION / MDM / ASSESSMENT AND PLAN / ED COURSE  As part of my medical decision making, I reviewed the following data within the electronic MEDICAL RECORD NUMBER Nursing notes reviewed and incorporated, Old chart reviewed, Notes from prior ED visits, and [place] Controlled Substance Database       *Daffney Curry was evaluated in Emergency Department on 10/29/2019 for the symptoms described in the history of present illness. She was evaluated in the context of the global COVID-19 pandemic, which necessitated consideration that the patient might be at risk for infection with the SARS-CoV-2 virus that causes COVID-19. Institutional protocols and algorithms that pertain to the evaluation of patients at risk for COVID-19 are in a state of rapid change based on information released by regulatory bodies including the CDC and federal and state organizations. These policies and algorithms were followed during the patient's care in the ED.  Some ED evaluations and interventions may be delayed as a result of limited staffing during the pandemic.*     Medical Decision Making: 49 year old female here with somewhat reproducible flank pain.  Suspect this is musculoskeletal in the setting of her work with [employer].  No specific injury.  Otherwise, lab work reviewed and is largely unremarkable.  Chest x-ray is without evidence of pneumonia.  CT scan shows no evidence of stone.  CBC and CMP are reassuring.  She does have nitrites on UA.  Unclear significance of this as she does not have pyuria, but given her flank pain, feels reasonable to treat with antibiotics.  Otherwise, will also give a dose of anti-inflammatory here, and sent home on scheduled naproxen with analgesia.  Return precautions given.  Pain is not consistent with ACS and EKG is  nonischemic.  She is not tachycardic, hypoxic, and has no cough or shortness of breath to suggest PE or alternative pulmonary source.  ____________________________________________  FINAL CLINICAL IMPRESSION(S) / ED DIAGNOSES  Final diagnoses:  Left flank pain     MEDICATIONS GIVEN DURING THIS VISIT:  Medications  ketorolac (TORADOL) injection 60 mg (60 mg Intramuscular Given 10/29/19 1429)     ED Discharge Orders         Ordered    HYDROcodone-acetaminophen (NORCO/VICODIN) 5-325 MG tablet  Every 6 hours PRN     Discontinue  Reprint     10/29/19 1530    naproxen (NAPROSYN) 375 MG tablet  2 times daily with meals     Discontinue  Reprint     10/29/19 1530    cephALEXin (KEFLEX)  500 MG capsule  3 times daily     Discontinue  Reprint     10/29/19 1530           Note:  This document was prepared using Dragon voice recognition software and may include unintentional dictation errors.   Shaune Pollack, MD 10/29/19 1714

## 2019-10-29 NOTE — ED Notes (Signed)
Pt updated in the WR, VS reassessed. 

## 2019-10-29 NOTE — ED Notes (Signed)
AAOx3.  C/O squeezing pain around upper abdomen and trunk.  States pain has been intermittent x 5 days.  Denies N/V.

## 2020-01-06 ENCOUNTER — Telehealth: Payer: Self-pay

## 2020-01-06 NOTE — Telephone Encounter (Signed)
Confirmed and screened for office visit on 8/25 

## 2020-01-08 ENCOUNTER — Encounter: Payer: Self-pay | Admitting: Hospice and Palliative Medicine

## 2020-01-08 ENCOUNTER — Other Ambulatory Visit: Payer: Self-pay

## 2020-01-08 ENCOUNTER — Ambulatory Visit: Payer: No Typology Code available for payment source | Admitting: Hospice and Palliative Medicine

## 2020-01-08 DIAGNOSIS — Z7689 Persons encountering health services in other specified circumstances: Secondary | ICD-10-CM

## 2020-01-08 DIAGNOSIS — G44211 Episodic tension-type headache, intractable: Secondary | ICD-10-CM

## 2020-01-08 DIAGNOSIS — R030 Elevated blood-pressure reading, without diagnosis of hypertension: Secondary | ICD-10-CM | POA: Diagnosis not present

## 2020-01-08 DIAGNOSIS — I700 Atherosclerosis of aorta: Secondary | ICD-10-CM

## 2020-01-08 NOTE — Progress Notes (Signed)
[facility] [address] [place], [place] [address]  Internal MEDICINE   Office Visit Note  Patient Name: Stacy Curry  [id number]  [id number]  Date of Service: 01/14/2020   Complaints/HPI Pt is here for establishment of PCP. Chief Complaint  Patient presents with   New Patient (Initial Visit)    blood pressure irregular recently, lower back pain, fatigued a lot, hot flashes   Quality Metric Gaps    HepC, HIV, screening, TDAP, pap, mammogram, colonoscopy   HPI  She has not been seen regularly be a PCP since 2019. Last CPE in 2019. She says what has prompted her visit today is about a week ago she had a crushing headache that came out of no where, she then was seeing black spots in her vision. She checked her BP and it was 170/107. Since then she has been paranoid about having elevated BP and having a stroke. Currently smokes about 2 packs of cigarettes a day. 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 The past year her menstrual cycles have eeb irregular.  Current Medication: Outpatient Encounter Medications as of 01/08/2020  Medication Sig   amoxicillin-clavulanate (AUGMENTIN) 875-125 MG tablet Take 1 tablet by mouth every 12 (twelve) hours. (Patient not taking: Reported on 01/08/2020)   cyclobenzaprine (FLEXERIL) 10 MG tablet Take 1 tablet (10 mg total) by mouth 3 (three) times daily as needed for muscle spasms. (Patient not taking: Reported on 01/08/2020)   HYDROcodone-acetaminophen (NORCO/VICODIN) 5-325 MG tablet Take 1-2 tablets by mouth every 6 (six) hours as needed for moderate pain or severe pain. (Patient not taking: Reported on 01/08/2020)   No facility-administered encounter medications on file as  of 01/08/2020.    Surgical History: Past Surgical History:  Procedure Laterality Date   TUBAL LIGATION      Medical History: Past Medical History:  Diagnosis Date   Allergy    Chicken pox age 9    Family History: Family History  Problem Relation Age of Onset   Cancer Mother    Heart attack Maternal Grandmother     Social History   Socioeconomic History   Marital status: Single    Spouse name: Not on file   Number of children: Not on file   Years of education: Not on file   Highest education level: Not on file  Occupational History   Not on file  Tobacco Use   Smoking status: Current Every Day Smoker   Smokeless tobacco: Never Used  Vaping Use   Vaping Use: Never used  Substance and Sexual Activity   Alcohol use: No   Drug use: No   Sexual activity: Not on file  Other Topics Concern   Not on file  Social History Narrative   Not on file   Social Determinants of Health   Financial Resource Strain:    Difficulty of Paying Living Expenses: Not on file  Food Insecurity:    Worried About Running Out of Food in the Last Year: Not on file   Ran Out of Food in the Last Year: Not on file  Transportation Needs:    Lack of Transportation (Medical): Not on file   Lack of Transportation (Non-Medical): Not on file  Physical  Activity:    Days of Exercise per Week: Not on file   Minutes of Exercise per Session: Not on file  Stress:    Feeling of Stress : Not on file  Social Connections:    Frequency of Communication with Friends and Family: Not on file   Frequency of Social Gatherings with Friends and Family: Not on file   Attends Religious Services: Not on file   Active Member of Clubs or Organizations: Not on file   Attends Banker Meetings: Not on file   Marital Status: Not on file  Intimate Partner Violence:    Fear of Current or Ex-Partner: Not on file   Emotionally Abused: Not on file   Physically Abused: Not  on file   Sexually Abused: Not on file     Review of Systems  Constitutional: Positive for fatigue. Negative for chills and diaphoresis.  HENT: Negative for congestion, sinus pressure, sinus pain and sore throat.   Eyes: Positive for visual disturbance. Negative for photophobia.  Respiratory: Negative for cough, shortness of breath and wheezing.   Cardiovascular: Negative for chest pain, palpitations and leg swelling.  Gastrointestinal: Negative for abdominal pain, constipation, diarrhea, nausea and vomiting.  Genitourinary: Negative for dysuria and flank pain.  Musculoskeletal: Positive for back pain. Negative for arthralgias, gait problem and neck pain.  Skin: Negative for color change and wound.  Allergic/Immunologic: Negative for environmental allergies and food allergies.  Neurological: Positive for headaches. Negative for dizziness, tremors and weakness.  Hematological: Does not bruise/bleed easily.  Psychiatric/Behavioral: Positive for sleep disturbance. Negative for agitation, behavioral problems (depression) and hallucinations. The patient is nervous/anxious.    Vital Signs: BP 140/81    Pulse 86    Temp 97.8 F (36.6 C)    Resp 16    Ht 5\' 4"  (1.626 m)    Wt 172 lb 9.6 oz (78.3 kg)    SpO2 99%    BMI 29.63 kg/m    Physical Exam Vitals reviewed.  Constitutional:      Appearance: Normal appearance.  HENT:     Nose: Nose normal.     Mouth/Throat:     Mouth: Mucous membranes are moist.     Pharynx: Oropharynx is clear.  Eyes:     General: Gaze aligned appropriately.     Extraocular Movements: Extraocular movements intact.     Comments: Right eyelid swollen  Cardiovascular:     Rate and Rhythm: Normal rate and regular rhythm.     Pulses: Normal pulses.     Heart sounds: Normal heart sounds.  Pulmonary:     Effort: Pulmonary effort is normal.     Breath sounds: Normal breath sounds.  Abdominal:     General: Abdomen is flat.     Palpations: Abdomen is soft.   Musculoskeletal:        General: Normal range of motion.     Cervical back: Normal range of motion.  Skin:    General: Skin is warm.  Neurological:     General: No focal deficit present.     Mental Status: She is alert and oriented to person, place, and time. Mental status is at baseline.  Psychiatric:        Mood and Affect: Mood normal.        Behavior: Behavior normal.        Thought Content: Thought content normal.    Assessment/Plan: 1. Intractable episodic tension-type headache Concerning symptoms of acute onset of crushing headache followed by visual  disturbances. Will assess CT and adjust plan of care accordingly. May need screening for OSA or further cardiology work-up. - CT Head Wo Contrast; Future  2. Aortic atherosclerosis (HCC) Found on recent renal CT. Will review carotid US for vascular disease. Will adjust therapy as appropriate. - US Carotid Duplex Bilateral; Future  3. Encounter to establish care with new doctor - labs ordered  - CBC w/Diff/Platelet - COMPLETE METABOLIC PANEL WITH GFR - Lipid Panel With LDL/HDL Ratio - Vitamin D 7,82 dihydroxy - B12 - TSH + free T4 - FSH/LH  4. Elevated blood pressure reading - Monitor blood pressure, if needed will add antihypertensives   General Counseling: Miamarie verbalizes understanding of the findings of todays visit and agrees with plan of treatment. I have discussed any further diagnostic evaluation that may be needed or ordered today. We also reviewed her medications today. she has been encouraged to call the office with any questions or concerns that should arise related to todays visit.  Orders Placed This Encounter  Procedures   CT Head Wo Contrast   US Carotid Duplex Bilateral   CBC w/Diff/Platelet   COMPLETE METABOLIC PANEL WITH GFR   Lipid Panel With LDL/HDL Ratio   Vitamin D 9,56 dihydroxy   B12   TSH + free T4   FSH/LH    Time spent: 30 Minutes  Time spent includes review of chart,  medications, test results and follow-up plan with the patient. This patient was seen by Blima Ledger AGNP-C in Collaboration with Dr Lyndon Code as a part of collaborative care agreement  Johnna Acosta AGNP-C Internal Medicine

## 2020-01-10 ENCOUNTER — Ambulatory Visit: Payer: No Typology Code available for payment source

## 2020-01-10 DIAGNOSIS — I6523 Occlusion and stenosis of bilateral carotid arteries: Secondary | ICD-10-CM | POA: Diagnosis not present

## 2020-01-10 DIAGNOSIS — I700 Atherosclerosis of aorta: Secondary | ICD-10-CM

## 2020-01-21 ENCOUNTER — Ambulatory Visit
Admission: RE | Admit: 2020-01-21 | Discharge: 2020-01-21 | Disposition: A | Discharge status: Home / Self Care | Payer: Self-pay | Source: Ambulatory Visit | Attending: Hospice and Palliative Medicine | Admitting: Hospice and Palliative Medicine

## 2020-01-21 ENCOUNTER — Other Ambulatory Visit: Payer: Self-pay

## 2020-01-21 DIAGNOSIS — G44211 Episodic tension-type headache, intractable: Secondary | ICD-10-CM | POA: Insufficient documentation

## 2020-02-02 NOTE — Procedures (Signed)
[facility] [address] [place], [place] [address]  DATE OF SERVICE: January 10, 2020  CAROTID DOPPLER INTERPRETATION:  Bilateral Carotid Ultrsasound and Color Doppler Examination was performed. The RIGHT CCA shows mild plaque in the vessel. The LEFT CCA shows mild plaque in the vessel. There was no significant intimal thickening noted in the RIGHT carotid artery. There was no significant intimal thickening in the LEFT carotid artery.  The RIGHT CCA shows peak systolic velocity of 42 cm per second. The end diastolic velocity is 12 cm per second on the RIGHT side. The RIGHT ICA shows peak systolic velocity of 35 per second. RIGHT sided ICA end diastolic velocity is 14 cm per second. The RIGHT ECA shows a peak systolic velocity of 79 cm per second. The ICA/CCA ratio is calculated to be 0.4. This suggests less than 50% stenosis. The Vertebral Artery shows antegrade flow.  The LEFT CCA shows peak systolic velocity of 47 cm per second. The end diastolic velocity is 13 cm per second on the LEFT side. The LEFT ICA shows peak systolic velocity of 42 per second. LEFT sided ICA end diastolic velocity is 15 cm per second. The LEFT ECA shows a peak systolic velocity of 83 cm per second. The ICA/CCA ratio is calculated to be 0.7. This suggests less than 50% stenosis. The Vertebral Artery shows antegrade flow.   Impression:    The RIGHT CAROTID shows less than 50% stenosis. The LEFT CAROTID shows less than 50% stenosis.  There is no significant plaque formation noted on the LEFT and no significant plaque on the RIGHT  side. Consider a repeat Carotid doppler if clinical situation and symptoms warrant in 6-12 months. Patient should be encouraged to change lifestyles such as smoking cessation, regular exercise and dietary modification. Use of statins in the right clinical setting and ASA is encouraged.  Yevonne Pax, MD [facility] Pulmonary Critical Care Medicine

## 2020-02-06 ENCOUNTER — Encounter: Payer: No Typology Code available for payment source | Admitting: Hospice and Palliative Medicine

## 2020-02-07 ENCOUNTER — Other Ambulatory Visit: Payer: No Typology Code available for payment source

## 2020-02-12 ENCOUNTER — Encounter: Payer: No Typology Code available for payment source | Admitting: Hospice and Palliative Medicine

## 2020-02-27 ENCOUNTER — Other Ambulatory Visit: Payer: Self-pay

## 2020-02-27 ENCOUNTER — Ambulatory Visit: Payer: 59 | Admitting: Hospice and Palliative Medicine

## 2020-02-27 ENCOUNTER — Encounter: Payer: Self-pay | Admitting: Hospice and Palliative Medicine

## 2020-02-27 VITALS — BP 154/94 | HR 103 | Resp 16 | Ht 64.0 in | Wt 172.4 lb

## 2020-02-27 DIAGNOSIS — R300 Dysuria: Secondary | ICD-10-CM

## 2020-02-27 DIAGNOSIS — R0789 Other chest pain: Secondary | ICD-10-CM

## 2020-02-27 DIAGNOSIS — Z1211 Encounter for screening for malignant neoplasm of colon: Secondary | ICD-10-CM

## 2020-02-27 DIAGNOSIS — Z0001 Encounter for general adult medical examination with abnormal findings: Secondary | ICD-10-CM | POA: Diagnosis not present

## 2020-02-27 DIAGNOSIS — I10 Essential (primary) hypertension: Secondary | ICD-10-CM

## 2020-02-27 DIAGNOSIS — M5442 Lumbago with sciatica, left side: Secondary | ICD-10-CM

## 2020-02-27 DIAGNOSIS — M5441 Lumbago with sciatica, right side: Secondary | ICD-10-CM

## 2020-02-27 DIAGNOSIS — R0602 Shortness of breath: Secondary | ICD-10-CM

## 2020-02-27 DIAGNOSIS — Z113 Encounter for screening for infections with a predominantly sexual mode of transmission: Secondary | ICD-10-CM

## 2020-02-27 DIAGNOSIS — Z124 Encounter for screening for malignant neoplasm of cervix: Secondary | ICD-10-CM

## 2020-02-27 MED ORDER — AMLODIPINE BESYLATE 5 MG PO TABS: 5.0000 mg | ORAL_TABLET | Freq: Every day | ORAL | 0 refills | Status: DC

## 2020-02-27 MED ORDER — METHYLPREDNISOLONE ACETATE 80 MG/ML IJ SUSP
40.0000 mg | Freq: Once | INTRAMUSCULAR | Status: AC
Start: 2020-02-27 — End: 2020-02-27
  Administered 2020-02-27: 40 mg via INTRAMUSCULAR

## 2020-02-27 NOTE — Progress Notes (Signed)
[facility] [address] [place], [place] [address]  Internal MEDICINE  Office Visit Note  Patient Name: Stacy Curry  [id number]  [id number]  Date of Service: 02/28/2020  Chief Complaint  Patient presents with  . Annual Exam  . Quality Metric Gaps    HIV screen, Tdap, mammogram, colonoscopy, flu vaccine, Hep C screen  . controlled substance policy    acknowledged  . Back Pain    right shoulder blade area.  feels like a pinched nerve.  started today again  . 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 cyclobenzaprine (FLEXERIL) 10 MG tablet Take 1 tablet (10 mg total) by mouth 3 (three) times daily as needed for muscle spasms.  Marland Kitchen amLODipine (NORVASC) 5 MG tablet Take 1 tablet (5 mg total) by mouth daily.  . [DISCONTINUED] amoxicillin-clavulanate (AUGMENTIN) 875-125 MG tablet Take 1 tablet by mouth every 12 (twelve) hours. (Patient not taking: Reported on 02/27/2020)  . [DISCONTINUED] HYDROcodone-acetaminophen (NORCO/VICODIN) 5-325 MG tablet Take 1-2 tablets by mouth every 6 (six) hours as needed for moderate pain or severe pain. (Patient not taking: Reported on 02/27/2020)  . [EXPIRED]  methylPREDNISolone acetate (DEPO-MEDROL) injection 40 mg    No facility-administered encounter medications on file as of 02/27/2020.    Surgical History: Past Surgical History:  Procedure Laterality Date  . TUBAL LIGATION      Medical History: Past Medical History:  Diagnosis Date  . Allergy   . Chicken pox age 24    Family History: Family History  Problem Relation Age of Onset  . Cancer Mother   . Heart attack Maternal Grandmother    Review of Systems  Constitutional: Negative for chills, diaphoresis and fatigue.  HENT: Negative for ear pain, postnasal drip and sinus pressure.   Eyes: Negative for photophobia, discharge, redness, itching and visual disturbance.  Respiratory: Negative for cough, shortness of breath and wheezing.   Cardiovascular: Negative for chest pain, palpitations and leg swelling.  Gastrointestinal: Negative for abdominal pain, constipation, diarrhea, nausea and vomiting.  Genitourinary: Negative for dysuria and flank pain.  Musculoskeletal: Positive for arthralgias and back pain. Negative for gait problem and neck pain.  Skin: Negative for color change.  Allergic/Immunologic: Negative for environmental allergies and food allergies.  Neurological: Positive for headaches. Negative for dizziness.  Hematological: Does not bruise/bleed easily.  Psychiatric/Behavioral: Negative for agitation, behavioral problems (depression) and hallucinations.     Vital Signs: BP (!) 154/94   Pulse (!) 103   Resp 16   Ht 5\' 4"  (1.626 m)   Wt 172 lb 6.4 oz (78.2 kg)   SpO2 98%   BMI 29.59 kg/m    Physical Exam Vitals reviewed. Exam conducted with a chaperone present.  Constitutional:  Appearance: Normal appearance.  Cardiovascular:     Rate and Rhythm: Normal rate and regular rhythm.     Pulses: Normal pulses.     Heart sounds: Normal heart sounds.  Pulmonary:     Effort: Pulmonary effort is normal.     Breath sounds: Normal breath sounds.  Chest:      Breasts:        Right: Normal.        Left: Normal.  Genitourinary:    Vagina: Normal.     Cervix: Normal.     Uterus: Normal.      Adnexa: Right adnexa normal and left adnexa normal.  Musculoskeletal:        General: Normal range of motion.     Cervical back: Normal range of motion.  Skin:    General: Skin is warm.  Neurological:     General: No focal deficit present.     Mental Status: She is alert and oriented to person, place, and time. Mental status is at baseline.  Psychiatric:        Mood and Affect: Mood normal.        Behavior: Behavior normal.        Thought Content: Thought content normal.     LABS: Recent Results (from the past 2160 hour(s))  UA/M w/rflx Culture, Routine     Status: None   Collection Time: 02/27/20  3:28 PM   Specimen: Urine   Urine  Result Value Ref Range   Specific Gravity, UA 1.020 1.005 - 1.030   pH, UA 5.5 5.0 - 7.5   Color, UA Yellow Yellow   Appearance Ur Clear Clear   Leukocytes,UA Negative Negative   Protein,UA Negative Negative/Trace   Glucose, UA Negative Negative   Ketones, UA Negative Negative   RBC, UA Negative Negative   Bilirubin, UA Negative Negative   Urobilinogen, Ur 0.2 0.2 - 1.0 mg/dL   Nitrite, UA Negative Negative   Microscopic Examination Comment     Comment: Microscopic follows if indicated.   Microscopic Examination See below:     Comment: Microscopic was indicated and was performed.   Urinalysis Reflex Comment     Comment: This specimen will not reflex to a Urine Culture.  Microscopic Examination     Status: Abnormal   Collection Time: 02/27/20  3:28 PM   Urine  Result Value Ref Range   WBC, UA None seen 0 - 5 /hpf   RBC 0-2 0 - 2 /hpf   Epithelial Cells (non renal) None seen 0 - 10 /hpf   Casts None seen None seen /lpf   Crystals Present (A) N/A   Crystal Type Calcium Oxalate N/A   Bacteria, UA None seen None seen/Few    Assessment/Plan: 1. Encounter for routine adult health examination with  abnormal findings Well appearing 49 year old female--order for mammogram as well as colonoscopy to have PHM updated Labs previously ordered--discussed importance of having these drawn - MM Digital Screening; Future  2. Essential hypertension Will start low dose amlodipine for HTN, will have close follow-up to monitor for response to therapy - amLODipine (NORVASC) 5 MG tablet; Take 1 tablet (5 mg total) by mouth daily.  Dispense: 90 tablet; Refill: 0  3. Screening for colon cancer - Ambulatory referral to Gastroenterology  4. Sensation of chest pressure Will rule out possible cardiac etiology for pressure sensation in chest that radiates from mid back - ECHOCARDIOGRAM COMPLETE; Future  5. Acute midline low back pain with bilateral sciatica  Treat with steroid injection for acute pain--will continue to monitor - methylPREDNISolone acetate (DEPO-MEDROL) injection 40 mg  6. Screen for STD (sexually transmitted disease) - NuSwab Vaginitis Plus (VG+)  7. Encounter for screening for malignant neoplasm of cervix - IGP, Aptima HPV  8. Dysuria - UA/M w/rflx Culture, Routine - Microscopic Examination  General Counseling: Chinenye verbalizes understanding of the findings of todays visit and agrees with plan of treatment. I have discussed any further diagnostic evaluation that may be needed or ordered today. We also reviewed her medications today. she has been encouraged to call the office with any questions or concerns that should arise related to todays visit.    Counseling: Hypertension Counseling:   The following hypertensive lifestyle modification were recommended and discussed:  1. Limiting alcohol intake to less than 1 oz/day of ethanol:(24 oz of beer or 8 oz of wine or 2 oz of 100-proof whiskey). 2. Take baby ASA 81 mg daily. 3. Importance of regular aerobic exercise and losing weight. 4. Reduce dietary saturated fat and cholesterol intake for overall cardiovascular health. 5.  Maintaining adequate dietary potassium, calcium, and magnesium intake. 6. Regular monitoring of the blood pressure. 7. Reduce sodium intake to less than 100 mmol/day (less than 2.3 gm of sodium or less than 6 gm of sodium choride)    Orders Placed This Encounter  Procedures  . Microscopic Examination  . MM Digital Screening  . UA/M w/rflx Culture, Routine  . NuSwab Vaginitis Plus (VG+)  . Ambulatory referral to Gastroenterology  . ECHOCARDIOGRAM COMPLETE    Meds ordered this encounter  Medications  . amLODipine (NORVASC) 5 MG tablet    Sig: Take 1 tablet (5 mg total) by mouth daily.    Dispense:  90 tablet    Refill:  0  . methylPREDNISolone acetate (DEPO-MEDROL) injection 40 mg    Total time spent: 35 Minutes  Time spent includes review of chart, medications, test results, and follow up plan with the patient.   This patient was seen by Brent General AGNP-C Collaboration with Dr Lyndon Code as a part of collaborative care agreement   Lubertha Basque. [facility] Internal Medicine

## 2020-02-28 ENCOUNTER — Encounter: Payer: Self-pay | Admitting: Hospice and Palliative Medicine

## 2020-02-28 LAB — MICROSCOPIC EXAMINATION
Bacteria, UA: NONE SEEN
Casts: NONE SEEN /lpf
Epithelial Cells (non renal): NONE SEEN /hpf (ref 0–10)
WBC, UA: NONE SEEN /hpf (ref 0–5)

## 2020-02-28 LAB — UA/M W/RFLX CULTURE, ROUTINE
Bilirubin, UA: NEGATIVE
Glucose, UA: NEGATIVE
Ketones, UA: NEGATIVE
Leukocytes,UA: NEGATIVE
Nitrite, UA: NEGATIVE
Protein,UA: NEGATIVE
RBC, UA: NEGATIVE
Specific Gravity, UA: 1.02 (ref 1.005–1.030)
Urobilinogen, Ur: 0.2 mg/dL (ref 0.2–1.0)
pH, UA: 5.5 (ref 5.0–7.5)

## 2020-03-01 LAB — NUSWAB VAGINITIS PLUS (VG+)
Candida albicans, NAA: NEGATIVE
Candida glabrata, NAA: NEGATIVE
Chlamydia trachomatis, NAA: NEGATIVE
Neisseria gonorrhoeae, NAA: NEGATIVE
Trich vag by NAA: POSITIVE — AB

## 2020-03-02 ENCOUNTER — Other Ambulatory Visit: Payer: Self-pay | Admitting: Hospice and Palliative Medicine

## 2020-03-02 ENCOUNTER — Telehealth: Payer: Self-pay

## 2020-03-02 MED ORDER — METRONIDAZOLE 500 MG PO TABS
500.0000 mg | ORAL_TABLET | Freq: Two times a day (BID) | ORAL | 0 refills | Status: DC
Start: 2020-03-02 — End: 2020-03-12

## 2020-03-02 NOTE — Telephone Encounter (Signed)
-----   Message from Theotis Burrow, NP sent at 03/02/2020  8:31 AM EDT ----- Please call Stacy Curry and inform her that she tested positive for trichomoniasis and I have sent Metronidazole 500 mg BID for seven days to her pharmacy. She needs to complete entire course of antibiotics, she must avoid alcohol while taking this medication and should take it with a meal to avoid GI upset.

## 2020-03-02 NOTE — Progress Notes (Signed)
Please call Stacy Curry and inform her that she tested positive for trichomoniasis and I have sent Metronidazole 500 mg BID for seven days to her pharmacy. She needs to complete entire course of antibiotics, she must avoid alcohol while taking this medication and should take it with a meal to avoid GI upset.

## 2020-03-02 NOTE — Telephone Encounter (Signed)
Pt.notified

## 2020-03-03 LAB — IGP, APTIMA HPV: HPV Aptima: NEGATIVE

## 2020-03-05 ENCOUNTER — Telehealth: Payer: Self-pay

## 2020-03-05 NOTE — Telephone Encounter (Signed)
Called to move [place] from 10:30 to 2pm

## 2020-03-06 ENCOUNTER — Encounter (INDEPENDENT_AMBULATORY_CARE_PROVIDER_SITE_OTHER): Payer: Self-pay

## 2020-03-06 ENCOUNTER — Other Ambulatory Visit: Payer: Self-pay

## 2020-03-06 ENCOUNTER — Ambulatory Visit
Admission: RE | Admit: 2020-03-06 | Discharge: 2020-03-06 | Disposition: A | Discharge status: Home / Self Care | Payer: 59 | Source: Ambulatory Visit | Attending: Hospice and Palliative Medicine | Admitting: Hospice and Palliative Medicine

## 2020-03-06 DIAGNOSIS — Z1231 Encounter for screening mammogram for malignant neoplasm of breast: Secondary | ICD-10-CM | POA: Insufficient documentation

## 2020-03-06 DIAGNOSIS — Z0001 Encounter for general adult medical examination with abnormal findings: Secondary | ICD-10-CM

## 2020-03-12 ENCOUNTER — Telehealth (INDEPENDENT_AMBULATORY_CARE_PROVIDER_SITE_OTHER): Payer: Self-pay | Admitting: Gastroenterology

## 2020-03-12 ENCOUNTER — Other Ambulatory Visit: Payer: Self-pay | Admitting: *Deleted

## 2020-03-12 ENCOUNTER — Inpatient Hospital Stay
Admission: RE | Admit: 2020-03-12 | Discharge: 2020-03-12 | Disposition: A | Discharge status: Home / Self Care | Payer: Self-pay | Source: Ambulatory Visit | Attending: *Deleted | Admitting: *Deleted

## 2020-03-12 DIAGNOSIS — Z1231 Encounter for screening mammogram for malignant neoplasm of breast: Secondary | ICD-10-CM

## 2020-03-12 DIAGNOSIS — Z1211 Encounter for screening for malignant neoplasm of colon: Secondary | ICD-10-CM

## 2020-03-12 MED ORDER — NA SULFATE-K SULFATE-MG SULF 17.5-3.13-1.6 GM/177ML PO SOLN: 1.0000 | Freq: Once | ORAL | 0 refills | Status: AC

## 2020-03-12 NOTE — Progress Notes (Signed)
Gastroenterology Pre-Procedure Review  Request Date: Monday 03/23/20 Requesting Physician: Dr. Maximino Greenland  PATIENT REVIEW QUESTIONS: The patient responded to the following health history questions as indicated:    1. Are you having any GI issues? no 2. Do you have a personal history of Polyps? no 3. Do you have a family history of Colon Cancer or Polyps? no 4. Diabetes Mellitus? no 5. Joint replacements in the past 12 months?no 6. Major health problems in the past 3 months?no 7. Any artificial heart valves, MVP, or defibrillator?no    MEDICATIONS & ALLERGIES:    Patient reports the following regarding taking any anticoagulation/antiplatelet therapy:   Plavix, Coumadin, Eliquis, Xarelto, Lovenox, Pradaxa, Brilinta, or Effient? no Aspirin? no  Patient confirms/reports the following medications:  Current Outpatient Medications  Medication Sig Dispense Refill  . amLODipine (NORVASC) 5 MG tablet Take 1 tablet (5 mg total) by mouth daily. 90 tablet 0   No current facility-administered medications for this visit.    Patient confirms/reports the following allergies:  Allergies  Allergen Reactions  . Penicillins Hives  . Codeine Swelling    No orders of the defined types were placed in this encounter.   AUTHORIZATION INFORMATION Primary Insurance: 1D#: Group #:  Secondary Insurance: 1D#: Group #:  SCHEDULE INFORMATION: Date: Monday 03/23/20 Time: Location:[facility]

## 2020-03-13 ENCOUNTER — Other Ambulatory Visit: Payer: Self-pay

## 2020-03-13 DIAGNOSIS — Z1211 Encounter for screening for malignant neoplasm of colon: Secondary | ICD-10-CM

## 2020-03-14 NOTE — Progress Notes (Signed)
Normal mammogram results reviewed.

## 2020-03-19 ENCOUNTER — Other Ambulatory Visit: Admission: RE | Admit: 2020-03-19 | Payer: Self-pay | Source: Ambulatory Visit

## 2020-03-23 ENCOUNTER — Ambulatory Visit: Admit: 2020-03-23 | Payer: Self-pay | Admitting: Gastroenterology

## 2020-03-23 SURGERY — COLONOSCOPY WITH PROPOFOL
Anesthesia: Choice

## 2020-03-24 ENCOUNTER — Encounter: Payer: Self-pay | Admitting: Hospice and Palliative Medicine

## 2020-03-25 ENCOUNTER — Other Ambulatory Visit: Payer: Self-pay | Admitting: Hospice and Palliative Medicine

## 2020-03-27 ENCOUNTER — Other Ambulatory Visit: Payer: 59

## 2020-03-31 ENCOUNTER — Encounter: Payer: Self-pay | Admitting: Hospice and Palliative Medicine

## 2020-04-03 ENCOUNTER — Ambulatory Visit: Payer: Self-pay | Admitting: Hospice and Palliative Medicine

## 2020-04-14 ENCOUNTER — Encounter: Payer: Self-pay | Admitting: Hospice and Palliative Medicine

## 2020-04-15 ENCOUNTER — Ambulatory Visit: Payer: 59 | Admitting: Hospice and Palliative Medicine

## 2020-05-14 ENCOUNTER — Other Ambulatory Visit: Payer: Self-pay | Admitting: Hospice and Palliative Medicine

## 2020-05-14 ENCOUNTER — Encounter: Payer: Self-pay | Admitting: Hospice and Palliative Medicine

## 2020-05-14 DIAGNOSIS — I10 Essential (primary) hypertension: Secondary | ICD-10-CM

## 2020-05-14 MED ORDER — AMLODIPINE BESYLATE 5 MG PO TABS: 5.0000 mg | ORAL_TABLET | Freq: Every day | ORAL | 0 refills | Status: DC

## 2020-06-15 ENCOUNTER — Encounter: Payer: Self-pay | Admitting: Hospice and Palliative Medicine

## 2020-06-15 ENCOUNTER — Ambulatory Visit (INDEPENDENT_AMBULATORY_CARE_PROVIDER_SITE_OTHER): Payer: 59 | Admitting: Hospice and Palliative Medicine

## 2020-06-15 VITALS — BP 140/88 | HR 95 | Temp 97.8°F | Resp 16 | Ht 64.0 in | Wt 171.0 lb

## 2020-06-15 DIAGNOSIS — I10 Essential (primary) hypertension: Secondary | ICD-10-CM | POA: Diagnosis not present

## 2020-06-15 DIAGNOSIS — G479 Sleep disorder, unspecified: Secondary | ICD-10-CM | POA: Diagnosis not present

## 2020-06-15 DIAGNOSIS — Z0001 Encounter for general adult medical examination with abnormal findings: Secondary | ICD-10-CM

## 2020-06-15 DIAGNOSIS — M5442 Lumbago with sciatica, left side: Secondary | ICD-10-CM

## 2020-06-15 DIAGNOSIS — M5441 Lumbago with sciatica, right side: Secondary | ICD-10-CM

## 2020-06-15 DIAGNOSIS — G44219 Episodic tension-type headache, not intractable: Secondary | ICD-10-CM | POA: Diagnosis not present

## 2020-06-15 MED ORDER — DULOXETINE HCL 20 MG PO CPEP: 20.0000 mg | ORAL_CAPSULE | Freq: Every day | ORAL | 3 refills | Status: DC

## 2020-06-15 MED ORDER — AMLODIPINE BESYLATE 10 MG PO TABS: 10.0000 mg | ORAL_TABLET | Freq: Every day | ORAL | 3 refills | Status: DC

## 2020-06-15 NOTE — Progress Notes (Signed)
[facility] [address] [place], [place] [address]  Internal MEDICINE  Office Visit Note  Patient Name: Stacy Curry  [id number]  [id number]  Date of Service: 06/16/2020  Chief Complaint  Patient presents with  . Follow-up  . Allergies  . Hypertension  . Quality Metric Gaps    colonoscopy  . 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 amLODipine (NORVASC) 10 MG tablet Take 1 tablet (10 mg total) by mouth daily.  . DULoxetine (CYMBALTA) 20 MG capsule Take 1 capsule (20 mg total) by mouth daily.  . [DISCONTINUED] amLODipine (NORVASC) 5 MG tablet Take 1 tablet (5 mg total) by mouth daily.   No facility-administered encounter medications on file as of 06/15/2020.    Surgical History: Past Surgical History:  Procedure Laterality Date  . TUBAL LIGATION      Medical History: Past Medical History:  Diagnosis Date  . Allergy   . Chicken pox age 13  . Hypertension     Family History: Family History  Problem Relation Age of  Onset  . Cancer Mother   . Heart attack Maternal Grandmother   . Breast cancer Neg Hx     Social History   Socioeconomic History  . Marital status: Single    Spouse name: Not on file  . Number of children: Not on file  . Years of education: Not on file  . Highest education level: Not on file  Occupational History  . Not on file  Tobacco Use  . Smoking status: Current Every Day Smoker  . Smokeless tobacco: Never Used  Vaping Use  . Vaping Use: Never used  Substance and Sexual Activity  . Alcohol use: No  . Drug use: No  . Sexual activity: Not on file  Other Topics Concern  . Not on file  Social History Narrative  . Not on file   Social Determinants of Health   Financial Resource Strain: Not on file  Food Insecurity: Not on file  Transportation Needs: Not on file  Physical Activity: Not on file  Stress: Not on file  Social Connections: Not on file  Intimate Partner Violence: Not on file      Review of Systems  Constitutional: Positive for fatigue. Negative for chills and diaphoresis.  HENT: Negative for ear pain, postnasal drip and sinus pressure.   Eyes: Negative for photophobia, discharge, redness, itching and visual disturbance.  Respiratory: Negative for cough, shortness of breath and wheezing.   Cardiovascular: Negative for chest pain, palpitations and leg swelling.  Gastrointestinal: Negative for abdominal pain, constipation, diarrhea, nausea  and vomiting.  Genitourinary: Negative for dysuria and flank pain.  Musculoskeletal: Positive for back pain. Negative for arthralgias, gait problem and neck pain.  Skin: Negative for color change.  Allergic/Immunologic: Negative for environmental allergies and food allergies.  Neurological: Positive for headaches. Negative for dizziness.  Hematological: Does not bruise/bleed easily.  Psychiatric/Behavioral: Negative for agitation, behavioral problems (depression) and hallucinations.    Vital Signs: BP 140/88    Pulse 95   Temp 97.8 F (36.6 C)   Resp 16   Ht 5\' 4"  (1.626 m)   Wt 171 lb (77.6 kg)   SpO2 100%   BMI 29.35 kg/m    Physical Exam Vitals reviewed.  Constitutional:      Appearance: Normal appearance. She is normal weight.  Cardiovascular:     Rate and Rhythm: Normal rate and regular rhythm.     Pulses: Normal pulses.     Heart sounds: Normal heart sounds.  Pulmonary:     Effort: Pulmonary effort is normal.     Breath sounds: Normal breath sounds.  Abdominal:     General: Abdomen is flat.     Palpations: Abdomen is soft.  Musculoskeletal:        General: Normal range of motion.     Cervical back: Normal range of motion.  Skin:    General: Skin is warm.  Neurological:     General: No focal deficit present.     Mental Status: She is alert and oriented to person, place, and time. Mental status is at baseline.  Psychiatric:        Mood and Affect: Mood normal.        Behavior: Behavior normal.        Thought Content: Thought content normal.        Judgment: Judgment normal.    EPWORTH SLEEPINESS SCALE:  Scale:  (0)= no chance of dozing; (1)= slight chance of dozing; (2)= moderate chance of dozing; (3)= high chance of dozing  Chance  Situtation    Sitting and reading: 2    Watching TV: 2    Sitting Inactive in public: 0    As a passenger in car: 1      Lying down to rest: 2    Sitting and talking: 0    Sitting quielty after lunch: 2    In a car, stopped in traffic: 0   TOTAL SCORE:   9 out of 24  Assessment/Plan: 1. Essential hypertension Increase dose of amlodipine 10 mg daily Will monitor closely for ankle edema--may require adjustment in therapy - amLODipine (NORVASC) 10 MG tablet; Take 1 tablet (10 mg total) by mouth daily.  Dispense: 90 tablet; Refill: 3 - CBC w/Diff/Platelet - Comprehensive Metabolic Panel (CMET) - TSH + free T4  2. Sleep disturbance [place] 9 out of 24 Risk factors include; overweight, insomnia, daytimes sleepiness and  fatigue, headaches as well as uncontrolled HTN - PSG Sleep Study; Future  3. Acute midline low back pain with bilateral sciatica Start Cymbalta for sciatica pain--will assess response to therapy with close follow-up - DULoxetine (CYMBALTA) 20 MG capsule; Take 1 capsule (20 mg total) by mouth daily.  Dispense: 30 capsule; Refill: 3  4. Episodic tension-type headache, not intractable Samples of Nurtec given in office today to help control headaches Tighter control of BP will help alleviate headaches  5. Encounter for routine adult health examination with abnormal findings - CBC w/Diff/Platelet - Comprehensive Metabolic Panel (CMET) - Lipid Panel With LDL/HDL Ratio - TSH +  free T4  General Counseling: Emmeline verbalizes understanding of the findings of todays visit and agrees with plan of treatment. I have discussed any further diagnostic evaluation that may be needed or ordered today. We also reviewed her medications today. she has been encouraged to call the office with any questions or concerns that should arise related to todays visit.  Hypertension Counseling:   The following hypertensive lifestyle modification were recommended and discussed:  1. Limiting alcohol intake to less than 1 oz/day of ethanol:(24 oz of beer or 8 oz of wine or 2 oz of 100-proof whiskey). 2. Take baby ASA 81 mg daily. 3. Importance of regular aerobic exercise and losing weight. 4. Reduce dietary saturated fat and cholesterol intake for overall cardiovascular health. 5. Maintaining adequate dietary potassium, calcium, and magnesium intake. 6. Regular monitoring of the blood pressure. 7. Reduce sodium intake to less than 100 mmol/day (less than 2.3 gm of sodium or less than 6 gm of sodium choride)   Orders Placed This Encounter  Procedures  . CBC w/Diff/Platelet  . Comprehensive Metabolic Panel (CMET)  . Lipid Panel With LDL/HDL Ratio  . TSH + free T4  . PSG Sleep Study    Meds ordered this encounter   Medications  . amLODipine (NORVASC) 10 MG tablet    Sig: Take 1 tablet (10 mg total) by mouth daily.    Dispense:  90 tablet    Refill:  3  . DULoxetine (CYMBALTA) 20 MG capsule    Sig: Take 1 capsule (20 mg total) by mouth daily.    Dispense:  30 capsule    Refill:  3    Time spent: 30 Minutes Time spent includes review of chart, medications, test results and follow-up plan with the patient.  This patient was seen by Leeanne Deed AGNP-C in Collaboration with Dr Lyndon Code as a part of collaborative care agreement     Lubertha Basque. Bryella Diviney AGNP-C Internal medicine

## 2020-06-16 ENCOUNTER — Encounter: Payer: Self-pay | Admitting: Hospice and Palliative Medicine

## 2020-06-16 ENCOUNTER — Telehealth: Payer: Self-pay

## 2020-06-16 NOTE — Telephone Encounter (Signed)
Gave [facility] orders for sleep study. Stacy Curry 

## 2020-06-18 ENCOUNTER — Encounter: Payer: Self-pay | Admitting: Hospice and Palliative Medicine

## 2020-06-22 ENCOUNTER — Telehealth: Payer: Self-pay

## 2020-06-22 NOTE — Telephone Encounter (Signed)
Faxed cologuard 

## 2020-06-22 NOTE — Telephone Encounter (Signed)
Pt is refusing sleep study.

## 2020-06-22 NOTE — Telephone Encounter (Signed)
-----   Message from Theotis Burrow, NP sent at 06/15/2020 10:48 AM EST ----- Can we set her up with [facility] please.

## 2020-07-17 ENCOUNTER — Encounter: Payer: Self-pay | Admitting: Hospice and Palliative Medicine

## 2020-07-17 ENCOUNTER — Other Ambulatory Visit: Payer: Self-pay | Admitting: Hospice and Palliative Medicine

## 2020-07-17 MED ORDER — AMLODIPINE BESYLATE 5 MG PO TABS: 5.0000 mg | ORAL_TABLET | Freq: Every day | ORAL | 1 refills | Status: DC

## 2020-07-17 MED ORDER — LOSARTAN POTASSIUM-HCTZ 50-12.5 MG PO TABS: 1.0000 | ORAL_TABLET | Freq: Every day | ORAL | 3 refills | Status: DC

## 2020-07-17 NOTE — Progress Notes (Signed)
Due to swelling in bilateral ankles, drop down amlodipine to 5 mg daily--add Hyzaar daily.

## 2020-07-25 LAB — COLOGUARD: COLOGUARD: NEGATIVE

## 2020-07-28 ENCOUNTER — Telehealth: Payer: Self-pay

## 2020-07-28 NOTE — Telephone Encounter (Signed)
Pt advised cologuard came back negative

## 2020-08-05 ENCOUNTER — Encounter: Payer: Self-pay | Admitting: Hospice and Palliative Medicine

## 2020-08-05 NOTE — Telephone Encounter (Signed)
Please add to either mine or Lauren's schedule.

## 2020-08-06 ENCOUNTER — Other Ambulatory Visit: Payer: Self-pay

## 2020-08-06 ENCOUNTER — Encounter: Payer: Self-pay | Admitting: Hospice and Palliative Medicine

## 2020-08-06 ENCOUNTER — Ambulatory Visit (INDEPENDENT_AMBULATORY_CARE_PROVIDER_SITE_OTHER): Payer: 59 | Admitting: Hospice and Palliative Medicine

## 2020-08-06 VITALS — BP 132/78 | HR 118 | Temp 97.6°F | Resp 16 | Ht 64.0 in | Wt 172.8 lb

## 2020-08-06 DIAGNOSIS — R059 Cough, unspecified: Secondary | ICD-10-CM | POA: Diagnosis not present

## 2020-08-06 DIAGNOSIS — J0140 Acute pansinusitis, unspecified: Secondary | ICD-10-CM

## 2020-08-06 MED ORDER — BENZONATATE 100 MG PO CAPS: 100.0000 mg | ORAL_CAPSULE | Freq: Two times a day (BID) | ORAL | 0 refills | Status: DC | PRN

## 2020-08-06 MED ORDER — PREDNISONE 10 MG PO TABS: ORAL_TABLET | ORAL | 0 refills | Status: DC

## 2020-08-06 MED ORDER — AZITHROMYCIN 250 MG PO TABS: ORAL_TABLET | ORAL | 0 refills | Status: DC

## 2020-08-06 NOTE — Progress Notes (Signed)
[facility] [address] [place], [place] [address]  Internal MEDICINE  Office Visit Note  Patient Name: Stacy Curry  [id number]  [id number]  Date of Service: 08/07/2020  Chief Complaint  Patient presents with  . Acute Visit    Possible sinus infection, pain in face/ sinuses, body aches     HPI Pt is here for a sick visit. C/o sinus pain and pressure, headaches, productive coughing--symptoms initially started Tuesday evening and have progressed since Has been taking over the counter cold and flu medicine without relief in symptoms Denies fevers, positive for body aches and chills  Current Medication:  Outpatient Encounter Medications as of 08/06/2020  Medication Sig  . azithromycin (ZITHROMAX Z-PAK) 250 MG tablet Take two 250 mg tablets on day one followed by one 250 mg tablet each day for four days.  . benzonatate (TESSALON) 100 MG capsule Take 1 capsule (100 mg total) by mouth 2 (two) times daily as needed for cough.  . predniSONE (DELTASONE) 10 MG tablet Take 1 tablet three times a day with a meal for three for three days, take 1 tablet by twice daily with a meal for 3 days, take 1 tablet once daily with a meal for 3 days  . amLODipine (NORVASC) 5 MG tablet Take 1 tablet (5 mg total) by mouth daily.  . DULoxetine (CYMBALTA) 20 MG capsule Take 1 capsule (20 mg total) by mouth daily.  Marland Kitchen losartan-hydrochlorothiazide (HYZAAR) 50-12.5 MG tablet Take 1 tablet by mouth daily.   No facility-administered encounter medications on file as of 08/06/2020.      Medical History: Past Medical History:  Diagnosis Date  . Allergy   . Chicken pox age 74  . Hypertension      Vital Signs: BP 132/78   Pulse (!) 118   Temp 97.6 F (36.4 C)   Resp 16   Ht 5\' 4"  (1.626 m)   Wt 172 lb 12.8 oz (78.4 kg)   SpO2 99%   BMI 29.66 kg/m    Review of Systems  Constitutional: Negative for chills, diaphoresis and fatigue.  HENT: Positive for congestion, sinus pressure and  sinus pain. Negative for ear pain and postnasal drip.   Eyes: Negative for photophobia, discharge, redness, itching and visual disturbance.  Respiratory: Positive for cough. Negative for shortness of breath and wheezing.   Cardiovascular: Negative for chest pain, palpitations and leg swelling.  Gastrointestinal: Negative for abdominal pain, constipation, diarrhea, nausea and vomiting.  Genitourinary: Negative for dysuria and flank pain.  Musculoskeletal: Negative for arthralgias, back pain, gait problem and neck pain.  Skin: Negative for color change.  Allergic/Immunologic: Negative for environmental allergies and food allergies.  Neurological: Negative for dizziness and headaches.  Hematological: Does not bruise/bleed easily.  Psychiatric/Behavioral: Negative for agitation, behavioral problems (depression) and hallucinations.    Physical Exam Vitals reviewed.  Constitutional:      Appearance: Normal appearance. She is normal weight.  HENT:     Right Ear: Tympanic membrane normal.     Left Ear: Tympanic membrane normal.     Nose: Congestion present.     Mouth/Throat:     Mouth: Mucous membranes are moist.     Pharynx: Oropharynx is clear.  Cardiovascular:     Rate and Rhythm: Normal rate and regular rhythm.     Pulses: Normal pulses.     Heart sounds: Normal heart sounds.  Pulmonary:     Effort: Pulmonary effort is normal.     Breath sounds: Normal breath sounds.  Neurological:     General: No focal deficit present.     Mental Status: She is alert and oriented to person, place, and time. Mental status is at baseline.  Psychiatric:        Mood and Affect: Mood normal.        Behavior: Behavior normal.        Thought Content: Thought content normal.        Judgment: Judgment normal.   Assessment/Plan: 1. Acute non-recurrent pansinusitis Treat with ZPAK, short course prednisone taper Advised to contact office if symptoms worsen or have not improved within 1 week -  azithromycin (ZITHROMAX Z-PAK) 250 MG tablet; Take two 250 mg tablets on day one followed by one 250 mg tablet each day for four days.  Dispense: 6 each; Refill: 0 - predniSONE (DELTASONE) 10 MG tablet; Take 1 tablet three times a day with a meal for three for three days, take 1 tablet by twice daily with a meal for 3 days, take 1 tablet once daily with a meal for 3 days  Dispense: 18 tablet; Refill: 0 - benzonatate (TESSALON) 100 MG capsule; Take 1 capsule (100 mg total) by mouth 2 (two) times daily as needed for cough.  Dispense: 20 capsule; Refill: 0  2. Cough Encouraged to take OTC Mucinex and Cepacol - benzonatate (TESSALON) 100 MG capsule; Take 1 capsule (100 mg total) by mouth 2 (two) times daily as needed for cough.  Dispense: 20 capsule; Refill: 0  General Counseling: Stacy Curry verbalizes understanding of the findings of todays visit and agrees with plan of treatment. I have discussed any further diagnostic evaluation that may be needed or ordered today. We also reviewed her medications today. she has been encouraged to call the office with any questions or concerns that should arise related to todays visit.  Meds ordered this encounter  Medications  . azithromycin (ZITHROMAX Z-PAK) 250 MG tablet    Sig: Take two 250 mg tablets on day one followed by one 250 mg tablet each day for four days.    Dispense:  6 each    Refill:  0  . predniSONE (DELTASONE) 10 MG tablet    Sig: Take 1 tablet three times a day with a meal for three for three days, take 1 tablet by twice daily with a meal for 3 days, take 1 tablet once daily with a meal for 3 days    Dispense:  18 tablet    Refill:  0  . benzonatate (TESSALON) 100 MG capsule    Sig: Take 1 capsule (100 mg total) by mouth 2 (two) times daily as needed for cough.    Dispense:  20 capsule    Refill:  0    Time spent: 25 Minutes Time spent includes review of chart, medications, test results and follow-up plan with the patient.  This patient  was seen by Leeanne Deed AGNP-C in Collaboration with Dr Lyndon Code as a part of collaborative care agreement.  Lubertha Basque [facility] Internal Medicine

## 2020-08-07 ENCOUNTER — Encounter: Payer: Self-pay | Admitting: Hospice and Palliative Medicine

## 2020-08-17 LAB — COLOGUARD

## 2020-08-20 ENCOUNTER — Encounter: Payer: Self-pay | Admitting: Hospice and Palliative Medicine

## 2020-08-21 ENCOUNTER — Other Ambulatory Visit: Payer: Self-pay | Admitting: Hospice and Palliative Medicine

## 2020-09-07 ENCOUNTER — Telehealth: Payer: Self-pay

## 2020-09-07 NOTE — Telephone Encounter (Signed)
Faxed FMLA paperwork to [employer] at [phone]. Copy placed in scan. Toni Amend

## 2020-09-07 NOTE — Telephone Encounter (Signed)
Please see

## 2020-09-08 ENCOUNTER — Ambulatory Visit: Payer: 59 | Admitting: Hospice and Palliative Medicine

## 2020-09-09 ENCOUNTER — Telehealth: Payer: Self-pay

## 2020-09-09 ENCOUNTER — Ambulatory Visit: Payer: 59 | Admitting: Hospice and Palliative Medicine

## 2020-09-09 NOTE — Telephone Encounter (Signed)
Re faxed requesting changes to fmla paperwork to [phone]. Copy placed in scan. Toni Amend

## 2020-09-16 ENCOUNTER — Telehealth: Payer: Self-pay

## 2020-09-16 ENCOUNTER — Ambulatory Visit (INDEPENDENT_AMBULATORY_CARE_PROVIDER_SITE_OTHER): Payer: 59 | Admitting: Hospice and Palliative Medicine

## 2020-09-16 ENCOUNTER — Other Ambulatory Visit: Payer: Self-pay

## 2020-09-16 ENCOUNTER — Encounter: Payer: Self-pay | Admitting: Hospice and Palliative Medicine

## 2020-09-16 VITALS — BP 138/84 | HR 89 | Temp 98.2°F | Resp 16 | Ht 64.0 in | Wt 175.2 lb

## 2020-09-16 DIAGNOSIS — I10 Essential (primary) hypertension: Secondary | ICD-10-CM

## 2020-09-16 DIAGNOSIS — R5383 Other fatigue: Secondary | ICD-10-CM

## 2020-09-16 DIAGNOSIS — G479 Sleep disorder, unspecified: Secondary | ICD-10-CM

## 2020-09-16 NOTE — Progress Notes (Signed)
[facility] [address] [place], [place] [address]  Internal MEDICINE  Office Visit Note  Patient Name: Stacy Curry  [id number]  [id number]  Date of Service: 09/17/2020  Chief Complaint  Patient presents with  . Follow-up    Lab review, and PSG was not done,  . Hypertension    HPI Patient is here for routine follow-up  Working on getting set up for sleep study--Feeling Stacy Curry is not within her network BP improved today--has been taking her medications and has recently started taking Beets supplements Headaches have also improved since her BP is better controlled Has not yet had a chance to have labs drawn   Current Medication: Outpatient Encounter Medications as of 09/16/2020  Medication Sig  . amLODipine (NORVASC) 5 MG tablet Take 1 tablet (5 mg total) by mouth daily.  Marland Kitchen losartan-hydrochlorothiazide (HYZAAR) 50-12.5 MG tablet Take 1 tablet by mouth daily.  . [DISCONTINUED] azithromycin (ZITHROMAX Z-PAK) 250 MG tablet Take two 250 mg tablets on day one followed by one 250 mg tablet each day for four days.  . [DISCONTINUED] benzonatate (TESSALON) 100 MG capsule Take 1 capsule (100 mg total) by mouth 2 (two) times daily as needed for cough.  . [DISCONTINUED] DULoxetine (CYMBALTA) 20 MG capsule Take 1 capsule (20 mg total) by mouth daily.  . [DISCONTINUED] predniSONE (DELTASONE) 10 MG tablet Take 1 tablet three times a day with a meal for three for three days, take 1 tablet by twice daily with a meal for 3 days, take 1 tablet once daily with a meal for 3 days   No facility-administered encounter medications on file as of 09/16/2020.    Surgical History: Past Surgical History:  Procedure Laterality Date  . TUBAL LIGATION      Medical History: Past Medical History:  Diagnosis Date  . Allergy   . Chicken pox age 44  . Hypertension     Family History: Family History  Problem Relation Age of Onset  . Cancer Mother   . Heart attack Maternal Grandmother   .  Breast cancer Neg Hx     Social History   Socioeconomic History  . Marital status: Single    Spouse name: Not on file  . Number of children: Not on file  . Years of education: Not on file  . Highest education level: Not on file  Occupational History  . Not on file  Tobacco Use  . Smoking status: Current Every Day Smoker  . Smokeless tobacco: Never Used  Vaping Use  . Vaping Use: Never used  Substance and Sexual Activity  . Alcohol use: No  . Drug use: No  . Sexual activity: Not on file  Other Topics Concern  . Not on file  Social History Narrative  . Not on file   Social Determinants of Health   Financial Resource Strain: Not on file  Food Insecurity: Not on file  Transportation Needs: Not on file  Physical Activity: Not on file  Stress: Not on file  Social Connections: Not on file  Intimate Partner Violence: Not on file      Review of Systems  Constitutional: Negative for chills, diaphoresis and fatigue.  HENT: Negative for ear pain, postnasal drip and sinus pressure.   Eyes: Negative for photophobia, discharge, redness, itching and visual disturbance.  Respiratory: Negative for cough, shortness of breath and wheezing.   Cardiovascular: Negative for chest pain, palpitations and leg swelling.  Gastrointestinal: Negative for abdominal pain, constipation, diarrhea, nausea and vomiting.  Genitourinary:  Negative for dysuria and flank pain.  Musculoskeletal: Negative for arthralgias, back pain, gait problem and neck pain.  Skin: Negative for color change.  Allergic/Immunologic: Negative for environmental allergies and food allergies.  Neurological: Negative for dizziness and headaches.  Hematological: Does not bruise/bleed easily.  Psychiatric/Behavioral: Negative for agitation, behavioral problems (depression) and hallucinations.    Vital Signs: BP 138/84   Pulse 89   Temp 98.2 F (36.8 C)   Resp 16   Ht 5\' 4"  (1.626 m)   Wt 175 lb 3.2 oz (79.5 kg)   SpO2  98%   BMI 30.07 kg/m    Physical Exam Vitals reviewed.  Constitutional:      Appearance: Normal appearance. She is normal weight.  Cardiovascular:     Rate and Rhythm: Normal rate and regular rhythm.     Pulses: Normal pulses.     Heart sounds: Normal heart sounds.  Pulmonary:     Effort: Pulmonary effort is normal.     Breath sounds: Normal breath sounds.  Abdominal:     General: Abdomen is flat.     Palpations: Abdomen is soft.  Musculoskeletal:        General: Normal range of motion.     Cervical back: Normal range of motion.  Skin:    General: Skin is warm.  Neurological:     General: No focal deficit present.     Mental Status: She is alert and oriented to person, place, and time. Mental status is at baseline.  Psychiatric:        Mood and Affect: Mood normal.        Behavior: Behavior normal.        Thought Content: Thought content normal.        Judgment: Judgment normal.    Assessment/Plan: 1. Essential hypertension BP and HR well controlled, continue to monitor  2. Sleep disturbance Will look into getting her set up for PSG within her network  3. Other fatigue - CBC w/Diff/Platelet - Comprehensive Metabolic Panel (CMET) - Lipid Panel With LDL/HDL Ratio - TSH + free T4  General Counseling: Stacy Curry verbalizes understanding of the findings of todays visit and agrees with plan of treatment. I have discussed any further diagnostic evaluation that may be needed or ordered today. We also reviewed her medications today. she has been encouraged to call the office with any questions or concerns that should arise related to todays visit.    Orders Placed This Encounter  Procedures  . CBC w/Diff/Platelet  . Comprehensive Metabolic Panel (CMET)  . Lipid Panel With LDL/HDL Ratio  . TSH + free T4    Time spent: 30 Minutes Time spent includes review of chart, medications, test results and follow-up plan with the patient.  This patient was seen by  AGNP-C in Collaboration with Dr Leeanne Deed as a part of collaborative care agreement     Lyndon Code. Corri Delapaz AGNP-C Internal medicine

## 2020-09-16 NOTE — Telephone Encounter (Signed)
Contacted patient and notified them that I spoke with [facility] and they are not within network with her insurance company. Patient was informed that she would have to contact her insurance company to see where she is in network. Patient agreed and stated they would contact us back. Erie Noe

## 2020-09-17 ENCOUNTER — Encounter: Payer: Self-pay | Admitting: Hospice and Palliative Medicine

## 2020-09-18 ENCOUNTER — Other Ambulatory Visit: Payer: Self-pay | Admitting: Hospice and Palliative Medicine

## 2020-09-18 ENCOUNTER — Telehealth: Payer: Self-pay

## 2020-09-18 NOTE — Telephone Encounter (Signed)
lmom  To call us back as per taylor pt need appt for repeat Pap

## 2020-10-16 ENCOUNTER — Encounter: Payer: Self-pay | Admitting: Nurse Practitioner

## 2020-11-01 ENCOUNTER — Encounter: Payer: Self-pay | Admitting: Nurse Practitioner

## 2020-11-06 NOTE — Telephone Encounter (Signed)
Please check.

## 2020-11-11 ENCOUNTER — Other Ambulatory Visit: Payer: Self-pay | Admitting: Nurse Practitioner

## 2020-11-11 DIAGNOSIS — Z0189 Encounter for other specified special examinations: Secondary | ICD-10-CM

## 2020-11-11 DIAGNOSIS — M25551 Pain in right hip: Secondary | ICD-10-CM

## 2020-11-11 DIAGNOSIS — G8929 Other chronic pain: Secondary | ICD-10-CM

## 2020-11-12 ENCOUNTER — Encounter: Payer: Self-pay | Admitting: Nurse Practitioner

## 2020-11-17 ENCOUNTER — Encounter: Payer: Self-pay | Admitting: Nurse Practitioner

## 2020-11-17 ENCOUNTER — Ambulatory Visit (INDEPENDENT_AMBULATORY_CARE_PROVIDER_SITE_OTHER): Payer: 59 | Admitting: Nurse Practitioner

## 2020-11-17 ENCOUNTER — Other Ambulatory Visit: Payer: Self-pay

## 2020-11-17 VITALS — BP 128/84 | HR 91 | Temp 98.3°F | Resp 16 | Ht 64.0 in | Wt 178.6 lb

## 2020-11-17 DIAGNOSIS — I700 Atherosclerosis of aorta: Secondary | ICD-10-CM | POA: Diagnosis not present

## 2020-11-17 DIAGNOSIS — Z0189 Encounter for other specified special examinations: Secondary | ICD-10-CM

## 2020-11-17 DIAGNOSIS — E559 Vitamin D deficiency, unspecified: Secondary | ICD-10-CM

## 2020-11-17 DIAGNOSIS — M25551 Pain in right hip: Secondary | ICD-10-CM | POA: Diagnosis not present

## 2020-11-17 DIAGNOSIS — M549 Dorsalgia, unspecified: Secondary | ICD-10-CM | POA: Diagnosis not present

## 2020-11-17 DIAGNOSIS — G8929 Other chronic pain: Secondary | ICD-10-CM

## 2020-11-17 NOTE — Progress Notes (Signed)
[facility] [address] [place], [place] [address]  Internal MEDICINE  Office Visit Note  Patient Name: Stacy Curry  [id number]  [id number]  Date of Service: 11/20/2020  Chief Complaint  Patient presents with   Follow-up    Poss mri, lab work, burning pain in between shoulder blades, lower back pain that moves down right thigh     HPI Sachiko presents for a follow up visit for mid back pain and low back pain with sciatica. She also has not had recent lab work done. She was seen at [facility] for her back pain and they did xrays and recommended following up with an MRI. 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 Requesting doctor's note extending her light duty through 12/13/20. She was accepted into the [employer] at [employer] and she will start on August 1st.   Current Medication: Outpatient Encounter Medications as of 11/17/2020  Medication Sig   amLODipine (NORVASC) 5 MG tablet Take 1 tablet (5 mg total) by mouth daily.   losartan-hydrochlorothiazide (HYZAAR) 50-12.5 MG tablet Take 1 tablet by mouth daily.   No facility-administered encounter medications on file as of 11/17/2020.    Surgical History: Past Surgical History:  Procedure Laterality Date   TUBAL LIGATION      Medical History: Past Medical History:  Diagnosis Date   Allergy    Chicken pox age 27   Hypertension     Family History: Family History  Problem Relation Age of Onset   Cancer Mother    Heart attack Maternal Grandmother    Breast cancer Neg Hx     Social History   Socioeconomic History   Marital status: Single    Spouse name: Not on file   Number of children: Not on file   Years of education: Not on file   Highest education level: Not on file  Occupational History   Not on file  Tobacco Use   Smoking status: Every Day    Pack years: 0.00   Smokeless tobacco: Never  Vaping Use   Vaping Use: Never used  Substance and Sexual Activity    Alcohol use: No   Drug use: No   Sexual activity: Not on file  Other Topics Concern   Not on file  Social History Narrative   Not on file   Social Determinants of Health   Financial Resource Strain: Not on file  Food Insecurity: Not on file  Transportation Needs: Not on file  Physical Activity: Not on file  Stress: Not on file  Social Connections: Not on file  Intimate Partner Violence: Not on file      Review of Systems  Constitutional:  Negative for appetite change, chills, fatigue and fever.  Respiratory: Negative.  Negative for cough, chest tightness, shortness of breath and wheezing.   Cardiovascular: Negative.  Negative for chest pain.  Gastrointestinal: Negative.  Negative for abdominal pain, constipation, diarrhea, nausea and vomiting.  Genitourinary: Negative.  Negative for difficulty urinating and menstrual problem.  Musculoskeletal:  Positive for arthralgias and back pain.  Skin: Negative.  Negative for rash.  Neurological: Negative.  Negative for dizziness, light-headedness and headaches.  Psychiatric/Behavioral: Negative.     Vital Signs: BP 128/84   Pulse 91   Temp 98.3 F (36.8 C)   Resp 16   Ht 5\' 4"  (1.626 m)   Wt 178 lb 9.6 oz (81 kg)   SpO2 98%   BMI 30.66 kg/m    Physical  Exam Vitals reviewed.  Constitutional:      General: She is not in acute distress.    Appearance: Normal appearance. She is obese. She is not ill-appearing.  HENT:     Head: Normocephalic and atraumatic.  Cardiovascular:     Rate and Rhythm: Normal rate and regular rhythm.     Pulses: Normal pulses.  Pulmonary:     Effort: Pulmonary effort is normal. No respiratory distress.  Skin:    General: Skin is warm and dry.     Capillary Refill: Capillary refill takes less than 2 seconds.  Neurological:     Mental Status: She is alert and oriented to person, place, and time.  Psychiatric:        Mood and Affect: Mood normal.        Behavior: Behavior normal.      Assessment/Plan: 1. Chronic right hip pain Xrays done at [facility], MRI recommended, and ordered.  - MR HIP RIGHT W WO CONTRAST; Future  2. Mid back pain, chronic MRI recommended and ordered. Xray done at [facility] - MR Thoracic Spine Wo Contrast; Future  3. Aortic atherosclerosis (HCC) Recheck lipid panel, echocardiogram reordered.  - Lipid Profile -echocardiogram complete.   4. Vitamin D deficiency Check vitamin D level.  - Vitamin D (25 hydroxy)  5. Encounter for routine laboratory testing Labs requested by patient, order send to [facility].  - CBC with Differential/Platelet - Comprehensive Metabolic Panel (CMET) - Thyroid Panel With TSH   General Counseling: Autumne verbalizes understanding of the findings of todays visit and agrees with plan of treatment. I have discussed any further diagnostic evaluation that may be needed or ordered today. We also reviewed her medications today. she has been encouraged to call the office with any questions or concerns that should arise related to todays visit.    Orders Placed This Encounter  Procedures   MR HIP RIGHT W WO CONTRAST   MR Thoracic Spine Wo Contrast   CBC with Differential/Platelet   Comprehensive Metabolic Panel (CMET)   Thyroid Panel With TSH   Vitamin D (25 hydroxy)   Lipid Profile    No orders of the defined types were placed in this encounter.   Return in about 3 months (around 02/17/2021) for CPE/PAP, Merrilyn Legler PCP.   Total time spent:30 Minutes Time spent includes review of chart, medications, test results, and follow up plan with the patient.   [place] Controlled Substance Database was reviewed by me.  This patient was seen by Sallyanne Kuster, FNP-C in collaboration with Dr. Beverely Risen as a part of collaborative care agreement.   Lashandra Arauz R. Tedd Sias, MSN, FNP-C Internal medicine

## 2020-12-16 ENCOUNTER — Other Ambulatory Visit: Payer: Self-pay

## 2020-12-16 ENCOUNTER — Ambulatory Visit
Admission: RE | Admit: 2020-12-16 | Discharge: 2020-12-16 | Disposition: A | Discharge status: Home / Self Care | Payer: 59 | Source: Ambulatory Visit | Attending: Nurse Practitioner | Admitting: Nurse Practitioner

## 2020-12-16 DIAGNOSIS — G8929 Other chronic pain: Secondary | ICD-10-CM | POA: Insufficient documentation

## 2020-12-16 DIAGNOSIS — M549 Dorsalgia, unspecified: Secondary | ICD-10-CM | POA: Insufficient documentation

## 2020-12-17 ENCOUNTER — Ambulatory Visit: Payer: 59 | Admitting: Nurse Practitioner

## 2021-01-11 ENCOUNTER — Encounter: Payer: Self-pay | Admitting: Nurse Practitioner

## 2021-01-11 ENCOUNTER — Other Ambulatory Visit: Payer: Self-pay | Admitting: Internal Medicine

## 2021-01-11 MED ORDER — LOSARTAN POTASSIUM-HCTZ 50-12.5 MG PO TABS: 1.0000 | ORAL_TABLET | Freq: Every day | ORAL | 0 refills | Status: DC

## 2021-01-11 MED ORDER — AMLODIPINE BESYLATE 5 MG PO TABS: 5.0000 mg | ORAL_TABLET | Freq: Every day | ORAL | 0 refills | Status: DC

## 2021-01-11 NOTE — Progress Notes (Unsigned)
done

## 2021-02-22 ENCOUNTER — Encounter: Payer: Self-pay | Admitting: Nurse Practitioner

## 2021-02-25 ENCOUNTER — Other Ambulatory Visit: Payer: Self-pay | Admitting: Nurse Practitioner

## 2021-02-25 DIAGNOSIS — E559 Vitamin D deficiency, unspecified: Secondary | ICD-10-CM

## 2021-02-25 DIAGNOSIS — R7301 Impaired fasting glucose: Secondary | ICD-10-CM

## 2021-02-25 DIAGNOSIS — D519 Vitamin B12 deficiency anemia, unspecified: Secondary | ICD-10-CM

## 2021-02-25 DIAGNOSIS — E039 Hypothyroidism, unspecified: Secondary | ICD-10-CM

## 2021-02-25 DIAGNOSIS — E782 Mixed hyperlipidemia: Secondary | ICD-10-CM

## 2021-03-04 ENCOUNTER — Encounter: Payer: Self-pay | Admitting: Nurse Practitioner

## 2021-03-04 DIAGNOSIS — R7301 Impaired fasting glucose: Secondary | ICD-10-CM

## 2021-03-05 ENCOUNTER — Other Ambulatory Visit
Admission: RE | Admit: 2021-03-05 | Discharge: 2021-03-05 | Disposition: A | Discharge status: Home / Self Care | Payer: 59 | Attending: Nurse Practitioner | Admitting: Nurse Practitioner

## 2021-03-05 DIAGNOSIS — E782 Mixed hyperlipidemia: Secondary | ICD-10-CM | POA: Insufficient documentation

## 2021-03-05 DIAGNOSIS — R7301 Impaired fasting glucose: Secondary | ICD-10-CM | POA: Diagnosis not present

## 2021-03-05 DIAGNOSIS — E039 Hypothyroidism, unspecified: Secondary | ICD-10-CM | POA: Insufficient documentation

## 2021-03-05 DIAGNOSIS — D519 Vitamin B12 deficiency anemia, unspecified: Secondary | ICD-10-CM | POA: Diagnosis present

## 2021-03-05 DIAGNOSIS — E559 Vitamin D deficiency, unspecified: Secondary | ICD-10-CM | POA: Insufficient documentation

## 2021-03-05 LAB — COMPREHENSIVE METABOLIC PANEL
ALT: 15 U/L (ref 0–44)
AST: 20 U/L (ref 15–41)
Albumin: 4.2 g/dL (ref 3.5–5.0)
Alkaline Phosphatase: 87 U/L (ref 38–126)
Anion gap: 9 (ref 5–15)
BUN: 12 mg/dL (ref 6–20)
CO2: 25 mmol/L (ref 22–32)
Calcium: 9.6 mg/dL (ref 8.9–10.3)
Chloride: 104 mmol/L (ref 98–111)
Creatinine, Ser: 0.83 mg/dL (ref 0.44–1.00)
GFR, Estimated: >60 mL/min (ref >60)
Glucose, Bld: 129 mg/dL — ABNORMAL HIGH (ref 70–99)
Potassium: 3.6 mmol/L (ref 3.5–5.1)
Sodium: 138 mmol/L (ref 135–145)
Total Bilirubin: 0.6 mg/dL (ref 0.3–1.2)
Total Protein: 7.9 g/dL (ref 6.5–8.1)

## 2021-03-05 LAB — CBC WITH DIFFERENTIAL/PLATELET
Abs Immature Granulocytes: 0.02 10*3/uL (ref 0.00–0.07)
Basophils Absolute: 0 10*3/uL (ref 0.0–0.1)
Basophils Relative: 0 %
Eosinophils Absolute: 0.1 10*3/uL (ref 0.0–0.5)
Eosinophils Relative: 1 %
HCT: 41.1 % (ref 36.0–46.0)
Hemoglobin: 14.2 g/dL (ref 12.0–15.0)
Immature Granulocytes: 0 %
Lymphocytes Relative: 51 %
Lymphs Abs: 4.1 10*3/uL — ABNORMAL HIGH (ref 0.7–4.0)
MCH: 31.7 pg (ref 26.0–34.0)
MCHC: 34.5 g/dL (ref 30.0–36.0)
MCV: 91.7 fL (ref 80.0–100.0)
Monocytes Absolute: 0.7 10*3/uL (ref 0.1–1.0)
Monocytes Relative: 9 %
Neutro Abs: 3.1 10*3/uL (ref 1.7–7.7)
Neutrophils Relative %: 39 %
Platelets: 324 10*3/uL (ref 150–400)
RBC: 4.48 MIL/uL (ref 3.87–5.11)
RDW: 13.8 % (ref 11.5–15.5)
WBC: 8.1 10*3/uL (ref 4.0–10.5)
nRBC: 0 % (ref 0.0–0.2)

## 2021-03-05 LAB — TSH: TSH: 1.67 u[IU]/mL (ref 0.350–4.500)

## 2021-03-05 LAB — VITAMIN B12: Vitamin B-12: 253 pg/mL (ref 180–914)

## 2021-03-05 LAB — LIPID PANEL
Cholesterol: 195 mg/dL (ref 0–200)
HDL: 38 mg/dL — ABNORMAL LOW (ref >40)
LDL Cholesterol: 141 mg/dL — ABNORMAL HIGH (ref 0–99)
Total CHOL/HDL Ratio: 5.1 RATIO
Triglycerides: 82 mg/dL (ref <150)
VLDL: 16 mg/dL (ref 0–40)

## 2021-03-05 LAB — VITAMIN D 25 HYDROXY (VIT D DEFICIENCY, FRACTURES): Vit D, 25-Hydroxy: 15.56 ng/mL — ABNORMAL LOW (ref 30–100)

## 2021-03-05 LAB — HEMOGLOBIN A1C
Hgb A1c MFr Bld: 6.7 % — ABNORMAL HIGH (ref 4.8–5.6)
Mean Plasma Glucose: 146 mg/dL

## 2021-03-05 LAB — FOLATE: Folate: 11.2 ng/mL (ref >5.9)

## 2021-03-05 LAB — T4, FREE: Free T4: 0.87 ng/dL (ref 0.61–1.12)

## 2021-03-09 ENCOUNTER — Encounter: Payer: Self-pay | Admitting: Nurse Practitioner

## 2021-03-09 ENCOUNTER — Other Ambulatory Visit: Payer: Self-pay

## 2021-03-09 ENCOUNTER — Ambulatory Visit (INDEPENDENT_AMBULATORY_CARE_PROVIDER_SITE_OTHER): Payer: 59 | Admitting: Nurse Practitioner

## 2021-03-09 VITALS — BP 110/86 | HR 100 | Temp 98.3°F | Resp 16 | Ht 64.0 in | Wt 190.8 lb

## 2021-03-09 DIAGNOSIS — Z124 Encounter for screening for malignant neoplasm of cervix: Secondary | ICD-10-CM

## 2021-03-09 DIAGNOSIS — Z23 Encounter for immunization: Secondary | ICD-10-CM

## 2021-03-09 DIAGNOSIS — Z0001 Encounter for general adult medical examination with abnormal findings: Secondary | ICD-10-CM | POA: Diagnosis not present

## 2021-03-09 DIAGNOSIS — E1165 Type 2 diabetes mellitus with hyperglycemia: Secondary | ICD-10-CM

## 2021-03-09 DIAGNOSIS — Z113 Encounter for screening for infections with a predominantly sexual mode of transmission: Secondary | ICD-10-CM

## 2021-03-09 DIAGNOSIS — I10 Essential (primary) hypertension: Secondary | ICD-10-CM

## 2021-03-09 DIAGNOSIS — I700 Atherosclerosis of aorta: Secondary | ICD-10-CM

## 2021-03-09 DIAGNOSIS — E559 Vitamin D deficiency, unspecified: Secondary | ICD-10-CM

## 2021-03-09 DIAGNOSIS — G8929 Other chronic pain: Secondary | ICD-10-CM

## 2021-03-09 DIAGNOSIS — Z01419 Encounter for gynecological examination (general) (routine) without abnormal findings: Secondary | ICD-10-CM

## 2021-03-09 DIAGNOSIS — G44229 Chronic tension-type headache, not intractable: Secondary | ICD-10-CM

## 2021-03-09 DIAGNOSIS — M25551 Pain in right hip: Secondary | ICD-10-CM

## 2021-03-09 DIAGNOSIS — R300 Dysuria: Secondary | ICD-10-CM

## 2021-03-09 DIAGNOSIS — E782 Mixed hyperlipidemia: Secondary | ICD-10-CM

## 2021-03-09 MED ORDER — TETANUS-DIPHTH-ACELL PERTUSSIS 5-2.5-18.5 LF-MCG/0.5 IM SUSP: 0.5000 mL | Freq: Once | INTRAMUSCULAR | 0 refills | Status: AC

## 2021-03-09 MED ORDER — SEMAGLUTIDE 3 MG PO TABS: ORAL_TABLET | ORAL | 2 refills | Status: DC

## 2021-03-09 MED ORDER — VITAMIN D (ERGOCALCIFEROL) 1.25 MG (50000 UNIT) PO CAPS: 50000.0000 [IU] | ORAL_CAPSULE | ORAL | 3 refills | Status: DC

## 2021-03-09 NOTE — Progress Notes (Signed)
[facility] [address] [place], [place] [address]  Internal MEDICINE  Office Visit Note  Patient Name: Stacy Curry  [id number]  [id number]  Date of Service: 03/09/2021  Chief Complaint  Patient presents with  . Annual Exam    Pain in lower back / hip, headaches   . Hypertension    HPI Stacy Curry presents for an annual well visit and physical exam. She has chronic low back and hip pain, hypertension and hyperlipidemia. Her blood pressure is well- controlled with losartan-hydrochlorothiazide and amlodipine.  She had labs drawn prior to office visit and the results were discussed today.  She has low vitamin D and borderline low B12.  Her lipid panel was normal except for low HDL of 38 and elevated LDL of 141.  Her A1C is 6.7 which is elevated in diabetic range and consistent with type 2 diabetes.  Her metabolic panel, CBC and thyroid levels are grossly normal.      Current Medication: Outpatient Encounter Medications as of 03/09/2021  Medication Sig  . amLODipine (NORVASC) 5 MG tablet Take 1 tablet (5 mg total) by mouth daily.  Marland Kitchen losartan-hydrochlorothiazide (HYZAAR) 50-12.5 MG tablet Take 1 tablet by mouth daily.  . Semaglutide 3 MG TABS Take 1 tablet in the morning for diabetes on empty stomach.  . SUMAtriptan (IMITREX) 50 MG tablet Take 1 tablet (50 mg total) by mouth every 2 (two) hours as needed for migraine or headache. May repeat in 2 hours if headache persists or recurs. No more than 6 doses per 24 hours. (Patient not taking: Reported on 03/24/2021)  . Vitamin D, Ergocalciferol, (DRISDOL) 1.25 MG (50000 UNIT) CAPS capsule Take 1 capsule (50,000 Units total) by mouth every 7 (seven) days. (Patient not taking: Reported on 03/24/2021)  . [DISCONTINUED] Tdap (BOOSTRIX) 5-2.5-18.5 LF-MCG/0.5 injection Inject 0.5 mLs into the muscle once.  . [EXPIRED] Tdap (BOOSTRIX) 5-2.5-18.5 LF-MCG/0.5 injection Inject 0.5 mLs into the muscle once for 1 dose.   No  facility-administered encounter medications on file as of 03/09/2021.    Surgical History: Past Surgical History:  Procedure Laterality Date  . TUBAL LIGATION      Medical History: Past Medical History:  Diagnosis Date  . Allergy   . Chicken pox age 68  . Diabetes mellitus without complication (HCC)   . Hypertension     Family History: Family History  Problem Relation Age of Onset  . Cancer Mother   . Diabetes Father   . Heart attack Maternal Grandmother   . Breast cancer Neg Hx     Social History   Socioeconomic History  . Marital status: Single    Spouse name: Not on file  . Number of children: Not on file  . Years of education: Not on file  . Highest education level: Not on file  Occupational History  . Not on file  Tobacco Use  . Smoking status: Every Day    Packs/day: 0.50    Types: Cigarettes  . Smokeless tobacco: Never  Vaping Use  . Vaping Use: Never used  Substance and Sexual Activity  . Alcohol use: Yes    Comment: 1-2 per month (wine or mixed)  . Drug use: No  . Sexual activity: Not on file  Other Topics Concern  . Not on file  Social History Narrative  . Not on file   Social Determinants of Health   Financial Resource Strain: Not on file  Food Insecurity: Not on file  Transportation Needs: Not on file  Physical  Activity: Not on file  Stress: Not on file  Social Connections: Not on file  Intimate Partner Violence: Not on file      Review of Systems  Constitutional:  Negative for activity change, appetite change, chills, fatigue, fever and unexpected weight change.  HENT: Negative.  Negative for congestion, ear pain, rhinorrhea, sore throat and trouble swallowing.   Eyes: Negative.   Respiratory: Negative.  Negative for cough, chest tightness, shortness of breath and wheezing.   Cardiovascular: Negative.  Negative for chest pain.  Gastrointestinal: Negative.  Negative for abdominal pain, blood in stool, constipation, diarrhea, nausea  and vomiting.  Endocrine: Negative.   Genitourinary: Negative.  Negative for difficulty urinating, dysuria, frequency, hematuria and urgency.  Musculoskeletal: Negative.  Negative for arthralgias, back pain, joint swelling, myalgias and neck pain.  Skin: Negative.  Negative for rash and wound.  Allergic/Immunologic: Negative.  Negative for immunocompromised state.  Neurological: Negative.  Negative for dizziness, seizures, numbness and headaches.  Hematological: Negative.   Psychiatric/Behavioral: Negative.  Negative for behavioral problems, self-injury and suicidal ideas. The patient is not nervous/anxious.    Vital Signs: BP 110/86   Pulse 100   Temp 98.3 F (36.8 C)   Resp 16   Ht 5\' 4"  (1.626 m)   Wt 190 lb 12.8 oz (86.5 kg)   SpO2 98%   BMI 32.75 kg/m    Physical Exam Vitals reviewed.  Constitutional:      General: She is awake. She is not in acute distress.    Appearance: Normal appearance. She is well-developed and well-groomed. She is not ill-appearing or diaphoretic.  HENT:     Head: Normocephalic and atraumatic.     Right Ear: Tympanic membrane, ear canal and external ear normal.     Left Ear: Tympanic membrane, ear canal and external ear normal.     Nose: Nose normal. No congestion or rhinorrhea.     Mouth/Throat:     Mouth: Mucous membranes are moist.     Pharynx: Oropharynx is clear. No oropharyngeal exudate or posterior oropharyngeal erythema.  Eyes:     General: No scleral icterus.       Right eye: No discharge.        Left eye: No discharge.     Extraocular Movements: Extraocular movements intact.     Conjunctiva/sclera: Conjunctivae normal.     Pupils: Pupils are equal, round, and reactive to light.  Neck:     Thyroid: No thyromegaly.     Vascular: No JVD.     Trachea: No tracheal deviation.  Cardiovascular:     Rate and Rhythm: Normal rate and regular rhythm.     Pulses: Normal pulses.     Heart sounds: Normal heart sounds. No murmur heard.   No  friction rub. No gallop.  Pulmonary:     Effort: Pulmonary effort is normal. No respiratory distress.     Breath sounds: Normal breath sounds. No stridor. No wheezing or rales.  Chest:     Chest wall: No tenderness.  Abdominal:     General: Bowel sounds are normal. There is no distension.     Palpations: Abdomen is soft. There is no mass.     Tenderness: There is no abdominal tenderness. There is no guarding or rebound.     Hernia: There is no hernia in the left inguinal area or right inguinal area.  Genitourinary:    General: Normal vulva.     Exam position: Lithotomy position.     Pubic Area:  No rash or pubic lice.      Labia:        Right: No rash, tenderness, lesion or injury.        Left: No rash, tenderness, lesion or injury.      Urethra: No prolapse, urethral swelling or urethral lesion.     Vagina: Normal. No signs of injury and foreign body. No vaginal discharge, erythema, tenderness, bleeding, lesions or prolapsed vaginal walls.     Cervix: No cervical motion tenderness, discharge, friability, lesion, erythema, cervical bleeding or eversion.     Uterus: Normal. Not deviated, not enlarged, not fixed, not tender and no uterine prolapse.      Adnexa: Right adnexa normal and left adnexa normal.     Rectum: Normal. No tenderness, anal fissure or external hemorrhoid. Normal anal tone.  Musculoskeletal:        General: No tenderness or deformity. Normal range of motion.     Cervical back: Normal range of motion and neck supple.  Lymphadenopathy:     Cervical: No cervical adenopathy.     Lower Body: No right inguinal adenopathy. No left inguinal adenopathy.  Skin:    General: Skin is warm and dry.     Capillary Refill: Capillary refill takes less than 2 seconds.     Coloration: Skin is not pale.     Findings: No erythema or rash.  Neurological:     Mental Status: She is alert and oriented to person, place, and time.     Cranial Nerves: No cranial nerve deficit.     Motor: No  abnormal muscle tone.     Coordination: Coordination normal.     Gait: Gait normal.     Deep Tendon Reflexes: Reflexes are normal and symmetric.  Psychiatric:        Mood and Affect: Mood normal.        Behavior: Behavior normal. Behavior is cooperative.        Thought Content: Thought content normal.        Judgment: Judgment normal.       Assessment/Plan: 1. Encounter for routine adult health examination with abnormal findings Age-appropriate preventive screenings and vaccinations discussed, annual physical exam completed. Routine labs for health maintenance drawn prior to office visit and discussed with patient today. PHM updated.   2. Type 2 diabetes mellitus with hyperglycemia, without long-term current use of insulin (HCC) New diagnosis of diabetes, patient wants to try medication that can also help with weight loss. 1 month sample of rybelsus provided, and a prescription was sent to pharmacy to start in 1 month.  - Semaglutide 3 MG TABS; Take 1 tablet in the morning for diabetes on empty stomach.  Dispense: 30 tablet; Refill: 2  3. Aortic atherosclerosis (HCC) As noted on imaging, will readdress starting statin therapy at next office visit.   4. Essential hypertension Stable with current medication  5. Chronic right hip pain Referral to sports medicine provider to see if a steroid joint injection would be beneficial.  - Ambulatory referral to Sports Medicine  6. Mixed hyperlipidemia Encouraged patient to take OTC fish oil supplement 1000 mg daily. Patient wants to attempt supplement and diet/lifestyle modifications before starting statin therapy.   7. Vitamin D deficiency Vitamin D level is 15, prescription sent to pharmacy.  - Vitamin D, Ergocalciferol, (DRISDOL) 1.25 MG (50000 UNIT) CAPS capsule; Take 1 capsule (50,000 Units total) by mouth every 7 (seven) days. (Patient not taking: Reported on 03/24/2021)  Dispense: 5 capsule; Refill: 3  8. Encounter for gynecological  examination without abnormal finding Routine pelvic exam, bimanual exam done, no abnormalities noted.   9. Encounter for screening for malignant neoplasm of cervix Routine pap done.  - IGP, Aptima HPV  10. Screen for sexually transmitted diseases Routine swab for STDs.  - NuSwab Vaginitis Plus (VG+)  11. Need for vaccination - Tdap (BOOSTRIX) 5-2.5-18.5 LF-MCG/0.5 injection; Inject 0.5 mLs into the muscle once for 1 dose.  Dispense: 0.5 mL; Refill: 0  12. Dysuria Routine urinalysis done - UA/M w/rflx Culture, Routine - Microscopic Examination  13. Chronic tension-type headache, not intractable Sumatriptan prescribed to help with acute headache or migraine.  - SUMAtriptan (IMITREX) 50 MG tablet; Take 1 tablet (50 mg total) by mouth every 2 (two) hours as needed for migraine or headache. May repeat in 2 hours if headache persists or recurs. No more than 6 doses per 24 hours. (Patient not taking: Reported on 03/24/2021)  Dispense: 16 tablet; Refill: 0      General Counseling: Analysia verbalizes understanding of the findings of todays visit and agrees with plan of treatment. I have discussed any further diagnostic evaluation that may be needed or ordered today. We also reviewed her medications today. she has been encouraged to call the office with any questions or concerns that should arise related to todays visit.    Orders Placed This Encounter  Procedures  . Microscopic Examination  . UA/M w/rflx Culture, Routine  . NuSwab Vaginitis Plus (VG+)  . Ambulatory referral to Sports Medicine    Meds ordered this encounter  Medications  . Tdap (BOOSTRIX) 5-2.5-18.5 LF-MCG/0.5 injection    Sig: Inject 0.5 mLs into the muscle once for 1 dose.    Dispense:  0.5 mL    Refill:  0  . Vitamin D, Ergocalciferol, (DRISDOL) 1.25 MG (50000 UNIT) CAPS capsule    Sig: Take 1 capsule (50,000 Units total) by mouth every 7 (seven) days.    Dispense:  5 capsule    Refill:  3  . Semaglutide 3 MG  TABS    Sig: Take 1 tablet in the morning for diabetes on empty stomach.    Dispense:  30 tablet    Refill:  2  . SUMAtriptan (IMITREX) 50 MG tablet    Sig: Take 1 tablet (50 mg total) by mouth every 2 (two) hours as needed for migraine or headache. May repeat in 2 hours if headache persists or recurs. No more than 6 doses per 24 hours.    Dispense:  16 tablet    Refill:  0    Return in about 3 months (around 06/09/2021) for F/U, Recheck A1C, Nitzia Perren PCP.   Total time spent:30 Minutes Time spent includes review of chart, medications, test results, and follow up plan with the patient.   [place] Controlled Substance Database was reviewed by me.  This patient was seen by Sallyanne Kuster, FNP-C in collaboration with Dr. Beverely Risen as a part of collaborative care agreement.  Vasilisa Vore R. Tedd Sias, MSN, FNP-C Internal medicine

## 2021-03-10 ENCOUNTER — Encounter: Payer: Self-pay | Admitting: Nurse Practitioner

## 2021-03-10 LAB — MICROSCOPIC EXAMINATION
Bacteria, UA: NONE SEEN
Casts: NONE SEEN /lpf
Epithelial Cells (non renal): NONE SEEN /hpf (ref 0–10)
WBC, UA: NONE SEEN /hpf (ref 0–5)

## 2021-03-10 LAB — UA/M W/RFLX CULTURE, ROUTINE
Bilirubin, UA: NEGATIVE
Glucose, UA: NEGATIVE
Ketones, UA: NEGATIVE
Leukocytes,UA: NEGATIVE
Nitrite, UA: NEGATIVE
Protein,UA: NEGATIVE
RBC, UA: NEGATIVE
Specific Gravity, UA: 1.007 (ref 1.005–1.030)
Urobilinogen, Ur: 0.2 mg/dL (ref 0.2–1.0)
pH, UA: 7 (ref 5.0–7.5)

## 2021-03-10 NOTE — Telephone Encounter (Signed)
Tina with [facility] stated to just put referral in Epic. Let me know when done and I will get it sent over.

## 2021-03-10 NOTE — Telephone Encounter (Signed)
Are you going to put in a referral for Nutritionist?

## 2021-03-11 ENCOUNTER — Telehealth: Payer: Self-pay

## 2021-03-11 ENCOUNTER — Other Ambulatory Visit: Payer: Self-pay | Admitting: Internal Medicine

## 2021-03-11 DIAGNOSIS — E1165 Type 2 diabetes mellitus with hyperglycemia: Secondary | ICD-10-CM

## 2021-03-11 LAB — NUSWAB VAGINITIS PLUS (VG+)
Atopobium vaginae: HIGH Score — AB
BVAB 2: HIGH Score — AB
Candida albicans, NAA: NEGATIVE
Candida glabrata, NAA: NEGATIVE
Chlamydia trachomatis, NAA: NEGATIVE
Megasphaera 1: HIGH Score — AB
Neisseria gonorrhoeae, NAA: NEGATIVE
Trich vag by NAA: NEGATIVE

## 2021-03-11 NOTE — Telephone Encounter (Signed)
It's done

## 2021-03-11 NOTE — Telephone Encounter (Signed)
Lifestyle referral sent to [facility] via epic. I lt patient vm letting her know it has been sent and to call [phone] if she has not heard from them by next week-Toni

## 2021-03-12 ENCOUNTER — Encounter: Payer: Self-pay | Admitting: Nurse Practitioner

## 2021-03-12 ENCOUNTER — Other Ambulatory Visit: Payer: Self-pay | Admitting: Nurse Practitioner

## 2021-03-12 DIAGNOSIS — B9689 Other specified bacterial agents as the cause of diseases classified elsewhere: Secondary | ICD-10-CM

## 2021-03-12 LAB — IGP, APTIMA HPV: HPV Aptima: NEGATIVE

## 2021-03-12 MED ORDER — METRONIDAZOLE 500 MG PO TABS: 500.0000 mg | ORAL_TABLET | Freq: Two times a day (BID) | ORAL | 0 refills | Status: DC

## 2021-03-12 MED ORDER — SUMATRIPTAN SUCCINATE 50 MG PO TABS: 50.0000 mg | ORAL_TABLET | ORAL | 0 refills | Status: DC | PRN

## 2021-03-12 NOTE — Progress Notes (Signed)
Please call the patient and let her know that her pap was negative for intraepithelial lesion and/or malignancy and HPV testing was negative as well. Her Nuswab was negative for sexually transmitted infections and yeast but she did have elevated levels that indicate bacterial vaginosis. I will sent a prescription for metronidazole to her pharmacy. Repeat pap smear in 5 years.

## 2021-03-18 ENCOUNTER — Encounter: Payer: Self-pay | Admitting: Nurse Practitioner

## 2021-03-21 ENCOUNTER — Encounter: Payer: Self-pay | Admitting: Nurse Practitioner

## 2021-03-24 ENCOUNTER — Encounter: Payer: Self-pay | Admitting: *Deleted

## 2021-03-24 ENCOUNTER — Other Ambulatory Visit: Payer: Self-pay

## 2021-03-24 ENCOUNTER — Encounter: Payer: 59 | Attending: Nurse Practitioner | Admitting: *Deleted

## 2021-03-24 VITALS — BP 126/82 | Ht 63.0 in | Wt 185.1 lb

## 2021-03-24 DIAGNOSIS — E119 Type 2 diabetes mellitus without complications: Secondary | ICD-10-CM | POA: Diagnosis not present

## 2021-03-24 NOTE — Patient Instructions (Signed)
Check blood sugars before breakfast or 2 hrs after supper 3 x week Bring blood sugar records to the next MD appointment  Call your doctor for a prescription for:  1. Meter strips (type) Accu-Chek Guide checking  3  times per week  2. Lancets (type) Accu-Chek Softclix checking  3     times per week  Exercise: Begin walking  for    15  minutes   3  days a week and gradually increase to 30 minutes  5 x week  Eat 3 meals day,   1-2  snacks a day Space meals 4-6 hours apart Continue to avoid sugar sweetened drinks (soda)  Quit smoking  Make an eye doctor appointment  Call back when you are able to schedule classes or another appointment with the dietitian

## 2021-03-25 ENCOUNTER — Encounter: Payer: Self-pay | Admitting: *Deleted

## 2021-03-25 NOTE — Progress Notes (Signed)
Diabetes Self-Management Education  Visit Type: First/Initial  Appt. Start Time: 1525 Appt. End Time: 1645  03/24/2021  [name], identified by name and date of birth, is a 50 y.o. female with a diagnosis of Diabetes: Type 2.   ASSESSMENT  Blood pressure 126/82, height 5\' 3"  (1.6 m), weight 185 lb 1.6 oz (84 kg). Body mass index is 32.79 kg/m.   Diabetes Self-Management Education - 03/24/21 1649       Visit Information   Visit Type First/Initial      Initial Visit   Diabetes Type Type 2    Are you currently following a meal plan? No    Are you taking your medications as prescribed? Yes    Date Diagnosed Mar 09, 2021      Health Coping   How would you rate your overall health? Good      Psychosocial Assessment   Patient Belief/Attitude about Diabetes Motivated to manage diabetes   "hate it for real"   Self-care barriers None    Self-management support Doctor's office;Family    Patient Concerns Nutrition/Meal planning;Medication;Monitoring;Healthy Lifestyle;Problem Solving;Glycemic Control;Weight Control    Special Needs None    Preferred Learning Style Hands on;Other (comment)   talking/discussion   Learning Readiness Change in progress    How often do you need to have someone help you when you read instructions, pamphlets, or other written materials from your doctor or pharmacy? 1 - Never    What is the last grade level you completed in school? some college      Pre-Education Assessment   Patient understands the diabetes disease and treatment process. Needs Instruction    Patient understands incorporating nutritional management into lifestyle. Needs Instruction    Patient undertands incorporating physical activity into lifestyle. Needs Instruction    Patient understands using medications safely. Needs Instruction    Patient understands monitoring blood glucose, interpreting and using results Needs Instruction    Patient understands prevention, detection, and  treatment of acute complications. Needs Instruction    Patient understands prevention, detection, and treatment of chronic complications. Needs Instruction    Patient understands how to develop strategies to address psychosocial issues. Needs Instruction    Patient understands how to develop strategies to promote health/change behavior. Needs Instruction      Complications   Last HgB A1C per patient/outside source 6.7 %   03/05/2021   How often do you check your blood sugar? 0 times/day (not testing)   Provided Accu-Chek Guide Me meter and instructed on use. BG upon return demonstration was 98 mg/dL at 03/07/2021 pm - 4 hrs pp.   Have you had a dilated eye exam in the past 12 months? No    Have you had a dental exam in the past 12 months? No    Are you checking your feet? Yes    How many days per week are you checking your feet? 7      Dietary Intake   Breakfast yogurt, oatmeal, banana; egg 1:63 bacon    Snack (morning) 0-1 snacks (kiwi, orange, grapes, strawberries)    Lunch left overs    Dinner baked or grilled chicken, [place] fish; potatoes, corn, rice, pasta, salad with lettuce, tomato, onions    Beverage(s) water, sugar sweetened tea, coffee, recently stopped drinking sodas      Exercise   Exercise Type ADL's      Patient Education   Previous Diabetes Education No    Disease state  Definition of diabetes, type 1  and 2, and the diagnosis of diabetes;Factors that contribute to the development of diabetes    Nutrition management  Role of diet in the treatment of diabetes and the relationship between the three main macronutrients and blood glucose level;Food label reading, portion sizes and measuring food.;Reviewed blood glucose goals for pre and post meals and how to evaluate the patients' food intake on their blood glucose level.;Meal timing in regards to the patients' current diabetes medication.    Physical activity and exercise  Role of exercise on diabetes management, blood pressure  control and cardiac health.    Medications Reviewed patients medication for diabetes, action, purpose, timing of dose and side effects.    Monitoring Taught/evaluated SMBG meter.;Purpose and frequency of SMBG.;Taught/discussed recording of test results and interpretation of SMBG.;Identified appropriate SMBG and/or A1C goals.    Chronic complications Relationship between chronic complications and blood glucose control    Psychosocial adjustment Identified and addressed patients feelings and concerns about diabetes      Individualized Goals (developed by patient)   Reducing Risk Other (comment)   improve blood sugars, decrease medications, prevent diabetes complications, lsoe weight, lead a healthier lifestyle, quit smoking, become more fit     Outcomes   Expected Outcomes Demonstrated interest in learning. Expect positive outcomes    Program Status Not Completed        Individualized Plan for Diabetes Self-Management Training:   Learning Objective:  Patient will have a greater understanding of diabetes self-management. Patient education plan is to attend individual and/or group sessions per assessed needs and concerns.   Plan:   Patient Instructions  Check blood sugars before breakfast or 2 hrs after supper 3 x week Bring blood sugar records to the next MD appointment  Call your doctor for a prescription for:  1. Meter strips (type) Accu-Chek Guide checking  3  times per week  2. Lancets (type) Accu-Chek Softclix checking  3     times per week  Exercise: Begin walking  for    15  minutes   3  days a week and gradually increase to 30 minutes  5 x week  Eat 3 meals day,   1-2  snacks a day Space meals 4-6 hours apart Continue to avoid sugar sweetened drinks (soda)  Quit smoking  Make an eye doctor appointment  Call back when you are able to schedule classes or another appointment with the dietitian  Expected Outcomes:  Demonstrated interest in learning. Expect positive  outcomes  Education material provided:  General Meal Planning Guidelines Simple Meal Plan Meter = Accu-Chek Guide Me  If problems or questions, patient to contact team via:   Sharion Settler, RN, CCM, CDCES [phone]  Future DSME appointment:  The patient is currently a student and will finish in January. She is not able to attend Diabetes classes at this time.

## 2021-03-28 ENCOUNTER — Other Ambulatory Visit: Payer: Self-pay | Admitting: Nurse Practitioner

## 2021-03-28 DIAGNOSIS — E1165 Type 2 diabetes mellitus with hyperglycemia: Secondary | ICD-10-CM

## 2021-03-28 MED ORDER — GLUCOSE BLOOD VI STRP: ORAL_STRIP | 12 refills | Status: DC

## 2021-03-28 MED ORDER — ACCU-CHEK SOFTCLIX LANCETS MISC: 12 refills | Status: DC

## 2021-04-01 ENCOUNTER — Telehealth: Payer: Self-pay

## 2021-04-01 NOTE — Telephone Encounter (Signed)
I sent sports medicine referral 03/08/21 to [facility]. They stated they had left vm for patient to return their call. I spoke with patient this morning. She stated she was not able to pull up link in mychart to schedule appointment. I explained to her it may not show in mychart since is not yet an established patient with them. I gave her their telephone # to call so she can schedule her appointment-[facility]

## 2021-04-04 ENCOUNTER — Encounter: Payer: Self-pay | Admitting: Nurse Practitioner

## 2021-04-05 ENCOUNTER — Encounter: Payer: Self-pay | Admitting: Nurse Practitioner

## 2021-04-23 ENCOUNTER — Encounter: Payer: Self-pay | Admitting: Nurse Practitioner

## 2021-04-24 ENCOUNTER — Other Ambulatory Visit: Payer: Self-pay

## 2021-04-24 MED ORDER — LOSARTAN POTASSIUM-HCTZ 50-12.5 MG PO TABS: 1.0000 | ORAL_TABLET | Freq: Every day | ORAL | 1 refills | Status: DC

## 2021-04-24 MED ORDER — AMLODIPINE BESYLATE 5 MG PO TABS: 5.0000 mg | ORAL_TABLET | Freq: Every day | ORAL | 1 refills | Status: DC

## 2021-04-28 ENCOUNTER — Ambulatory Visit (INDEPENDENT_AMBULATORY_CARE_PROVIDER_SITE_OTHER): Payer: 59 | Admitting: Family Medicine

## 2021-04-28 ENCOUNTER — Other Ambulatory Visit: Payer: Self-pay

## 2021-04-28 ENCOUNTER — Ambulatory Visit
Admission: RE | Admit: 2021-04-28 | Discharge: 2021-04-28 | Disposition: A | Discharge status: Home / Self Care | Payer: 59 | Attending: Family Medicine | Admitting: Family Medicine

## 2021-04-28 ENCOUNTER — Encounter: Payer: Self-pay | Admitting: Family Medicine

## 2021-04-28 ENCOUNTER — Ambulatory Visit
Admission: RE | Admit: 2021-04-28 | Discharge: 2021-04-28 | Disposition: A | Discharge status: Home / Self Care | Payer: 59 | Source: Ambulatory Visit | Attending: Family Medicine | Admitting: Family Medicine

## 2021-04-28 VITALS — BP 122/84 | HR 78 | Ht 63.0 in | Wt 187.0 lb

## 2021-04-28 DIAGNOSIS — M25551 Pain in right hip: Secondary | ICD-10-CM | POA: Insufficient documentation

## 2021-04-28 DIAGNOSIS — M533 Sacrococcygeal disorders, not elsewhere classified: Secondary | ICD-10-CM

## 2021-04-28 DIAGNOSIS — M7061 Trochanteric bursitis, right hip: Secondary | ICD-10-CM | POA: Insufficient documentation

## 2021-04-28 DIAGNOSIS — G8929 Other chronic pain: Secondary | ICD-10-CM | POA: Insufficient documentation

## 2021-04-28 MED ORDER — CYCLOBENZAPRINE HCL 10 MG PO TABS
10.0000 mg | ORAL_TABLET | Freq: Three times a day (TID) | ORAL | 0 refills | Status: DC | PRN
Start: 2021-04-28 — End: 2021-05-21

## 2021-04-28 MED ORDER — MELOXICAM 15 MG PO TABS: 15.0000 mg | ORAL_TABLET | Freq: Every day | ORAL | 0 refills | Status: DC

## 2021-04-28 NOTE — Assessment & Plan Note (Signed)
See additional assessment(s) for plan details. 

## 2021-04-28 NOTE — Assessment & Plan Note (Signed)
Patient with acute on chronic exacerbation of right posterior hip pain, does state mild radiation into the proximal right leg/thigh.  Denies any weakness, mild secondary low back pain.  Examination reveals focal tenderness that recreates her symptoms at the right SI joint, secondary along the gluteus medius/minimus complex, and at the right greater trochanter.  Negative straight leg raise, positive Pearlean Brownie, remainder and contralateral testing benign.  I have advised patient on various treatment strategies, plan to initiate scheduled meloxicam, cyclobenzaprine, obtain dedicated x-rays of the lumbar spine and hip/pelvis, home-based exercises, and maintain close follow-up.  If suboptimal response noted despite adherence to the above, can consider local ultrasound-guided injections.

## 2021-04-28 NOTE — Progress Notes (Signed)
°  ° °  Primary Care / Sports Medicine Office Visit  Patient Information:  Patient ID: Stacy Curry, female DOB: 06-07-1970 Age: 50 y.o. MRN: [id number]   Stacy Curry is a pleasant 50 y.o. female presenting with the following:  Chief Complaint  Patient presents with   New Patient (Initial Visit)   Hip Pain    Bilateral; right>left; x1 year; no known recent injury or trauma; MRI thoracic spine 12/2020; per patient had a fall over a year ago when she slipped and fell to the floor; received cortisone shot through [facility] last year which relieved symptoms for 4-5 months, has also been using resistance bands to help with stretching, using heat and ice, taking ibuprofen, Tylenol, and Aleve as needed with minimal relief; describes as stiffness, throbbing, achy, intermittently sharp, radiates down legs;3/10 pain    Patient Active Problem List   Diagnosis Date Noted   Chronic right SI joint pain 04/28/2021   Greater trochanteric bursitis, right 04/28/2021    Vitals:   04/28/21 1552  BP: 122/84  Pulse: 78  SpO2: 97%   Vitals:   04/28/21 1552  Weight: 187 lb (84.8 kg)  Height: 5\' 3"  (1.6 m)   Body mass index is 33.13 kg/m.  No results found.   Independent interpretation of notes and tests performed by another provider:   None  Procedures performed:   None  Pertinent History, Exam, Impression, and Recommendations:   Chronic right SI joint pain Patient with acute on chronic exacerbation of right posterior hip pain, does state mild radiation into the proximal right leg/thigh.  Denies any weakness, mild secondary low back pain.  Examination reveals focal tenderness that recreates her symptoms at the right SI joint, secondary along the gluteus medius/minimus complex, and at the right greater trochanter.  Negative straight leg raise, positive , remainder and contralateral testing benign.  I have advised patient on various treatment strategies, plan to initiate scheduled  meloxicam, cyclobenzaprine, obtain dedicated x-rays of the lumbar spine and hip/pelvis, home-based exercises, and maintain close follow-up.  If suboptimal response noted despite adherence to the above, can consider local ultrasound-guided injections.  Greater trochanteric bursitis, right See additional assessment(s) for plan details.   Orders & Medications Meds ordered this encounter  Medications   meloxicam (MOBIC) 15 MG tablet    Sig: Take 1 tablet (15 mg total) by mouth daily.    Dispense:  30 tablet    Refill:  0   cyclobenzaprine (FLEXERIL) 10 MG tablet    Sig: Take 1 tablet (10 mg total) by mouth 3 (three) times daily as needed for muscle spasms.    Dispense:  30 tablet    Refill:  0   Orders Placed This Encounter  Procedures   DG Lumbar Spine Complete   DG Hip Unilat W OR W/O Pelvis 2-3 Views Right     No follow-ups on file.     Pearlean Brownie, MD   Primary Care Sports Medicine [facility]

## 2021-04-28 NOTE — Patient Instructions (Signed)
-   Start meloxicam daily - Start cyclobenzaprine nightly - Get x-rays today - Start exercises - Return in 4 weeks - Contact for questions

## 2021-05-03 ENCOUNTER — Encounter: Payer: Self-pay | Admitting: Nurse Practitioner

## 2021-05-03 ENCOUNTER — Other Ambulatory Visit: Payer: Self-pay | Admitting: Nurse Practitioner

## 2021-05-04 ENCOUNTER — Other Ambulatory Visit: Payer: Self-pay | Admitting: Nurse Practitioner

## 2021-05-06 ENCOUNTER — Encounter: Payer: Self-pay | Admitting: Internal Medicine

## 2021-05-11 ENCOUNTER — Other Ambulatory Visit: Payer: Self-pay

## 2021-05-11 ENCOUNTER — Encounter: Payer: Self-pay | Admitting: Nurse Practitioner

## 2021-05-11 ENCOUNTER — Ambulatory Visit: Payer: 59 | Admitting: Nurse Practitioner

## 2021-05-11 VITALS — BP 130/83 | HR 79 | Temp 98.4°F | Resp 16 | Ht 63.0 in | Wt 189.6 lb

## 2021-05-11 DIAGNOSIS — Z711 Person with feared health complaint in whom no diagnosis is made: Secondary | ICD-10-CM

## 2021-05-11 NOTE — Progress Notes (Signed)
[facility] [address] [place], [place] [address]  Internal MEDICINE  Office Visit Note  Patient Name: Stacy Curry  [id number]  [id number]  Date of Service: 05/11/2021  Chief Complaint  Patient presents with   Acute Visit    Rt breast lump; pt no longer feels the lump but still wants to make sure     HPI Stacy Curry presents for an acute sick visit for worried about right breast lump that she felt last week when she made the appointment for today. She no longer feels the lump today.    Current Medication:  Outpatient Encounter Medications as of 05/11/2021  Medication Sig   Accu-Chek Softclix Lancets lancets Use as instructed   acetaminophen (TYLENOL) 500 MG tablet Take 1,000 mg by mouth daily as needed.   amLODipine (NORVASC) 5 MG tablet Take 1 tablet (5 mg total) by mouth daily.   cyclobenzaprine (FLEXERIL) 10 MG tablet Take 1 tablet (10 mg total) by mouth 3 (three) times daily as needed for muscle spasms.   glucose blood test strip Use as instructed   ibuprofen (ADVIL) 200 MG tablet Take 800 mg by mouth daily as needed.   losartan-hydrochlorothiazide (HYZAAR) 50-12.5 MG tablet Take 1 tablet by mouth daily.   meloxicam (MOBIC) 15 MG tablet Take 1 tablet (15 mg total) by mouth daily.   Multiple Vitamin (MULTIVITAMIN) tablet Take 1 tablet by mouth daily.   naproxen sodium (ALEVE) 220 MG tablet Take 440 mg by mouth daily as needed.   Semaglutide 3 MG TABS Take 1 tablet in the morning for diabetes on empty stomach.   SUMAtriptan (IMITREX) 50 MG tablet Take 1 tablet (50 mg total) by mouth every 2 (two) hours as needed for migraine or headache. May repeat in 2 hours if headache persists or recurs. No more than 6 doses per 24 hours.   Vitamin D, Ergocalciferol, (DRISDOL) 1.25 MG (50000 UNIT) CAPS capsule Take 1 capsule (50,000 Units total) by mouth every 7 (seven) days.   No facility-administered encounter medications on file as of 05/11/2021.      Medical  History: Past Medical History:  Diagnosis Date   Allergy    Chicken pox age 50   Diabetes mellitus without complication (HCC)    Hypertension      Vital Signs: BP 130/83    Pulse 79    Temp 98.4 F (36.9 C)    Resp 16    Wt 189 lb 9.6 oz (86 kg)    SpO2 97%    BMI 33.59 kg/m    Review of Systems  Constitutional:  Negative for chills, fatigue and unexpected weight change.  HENT:  Negative for congestion, rhinorrhea, sneezing and sore throat.   Eyes:  Negative for redness.  Respiratory:  Negative for cough, chest tightness and shortness of breath.   Cardiovascular:  Negative for chest pain and palpitations.  Gastrointestinal:  Negative for abdominal pain, constipation, diarrhea, nausea and vomiting.  Genitourinary:  Negative for dysuria and frequency.  Musculoskeletal:  Negative for arthralgias, back pain, joint swelling and neck pain.  Skin:  Negative for rash.  Neurological: Negative.  Negative for tremors and numbness.  Hematological:  Negative for adenopathy. Does not bruise/bleed easily.  Psychiatric/Behavioral:  Negative for behavioral problems (Depression), sleep disturbance and suicidal ideas. The patient is not nervous/anxious.    Physical Exam Constitutional:      Appearance: Normal appearance. She is obese.  Eyes:     Pupils: Pupils are equal, round, and reactive to light.  Pulmonary:     Effort: Pulmonary effort is normal. No respiratory distress.  Chest:  Breasts:    Right: Normal. No swelling, bleeding, inverted nipple, mass, nipple discharge, skin change or tenderness.     Left: Normal. No swelling, bleeding, inverted nipple, mass, nipple discharge, skin change or tenderness.  Lymphadenopathy:     Upper Body:     Right upper body: No supraclavicular, axillary or pectoral adenopathy.     Left upper body: No supraclavicular, axillary or pectoral adenopathy.  Neurological:     Mental Status: She is alert and oriented to person, place, and time.  Psychiatric:         Mood and Affect: Mood normal.        Behavior: Behavior normal.      Assessment/Plan: 1. Person with feared complaint in whom no diagnosis was made Patient worried she has a breast lump but states it had gone away prior to her visit today. No breast lump appreciated on physical exam. Patient instructed to call the clinic if the lump returns. No mammogram or ultrasound indicated at this time.    General Counseling: Stacy Curry verbalizes understanding of the findings of todays visit and agrees with plan of treatment. I have discussed any further diagnostic evaluation that may be needed or ordered today. We also reviewed her medications today. she has been encouraged to call the office with any questions or concerns that should arise related to todays visit.    Counseling:    No orders of the defined types were placed in this encounter.   No orders of the defined types were placed in this encounter.   Return if symptoms worsen or fail to improve.  [place] Controlled Substance Database was reviewed by me for overdose risk score (ORS)  Time spent:15 Minutes Time spent with patient included reviewing progress notes, labs, imaging studies, and discussing plan for follow up.   This patient was seen by Sallyanne Kuster, FNP-C in collaboration with Dr. Beverely Risen as a part of collaborative care agreement.  Ervin Rothbauer R. Tedd Sias, MSN, FNP-C Internal Medicine

## 2021-05-21 ENCOUNTER — Other Ambulatory Visit: Payer: Self-pay | Admitting: Family Medicine

## 2021-05-21 ENCOUNTER — Encounter: Payer: Self-pay | Admitting: Family Medicine

## 2021-05-21 DIAGNOSIS — M7061 Trochanteric bursitis, right hip: Secondary | ICD-10-CM

## 2021-05-21 DIAGNOSIS — G8929 Other chronic pain: Secondary | ICD-10-CM

## 2021-05-21 MED ORDER — CHLORZOXAZONE 500 MG PO TABS: 250.0000 mg | ORAL_TABLET | Freq: Three times a day (TID) | ORAL | 0 refills | Status: DC | PRN

## 2021-05-21 NOTE — Telephone Encounter (Signed)
Please review.  KP

## 2021-05-25 ENCOUNTER — Other Ambulatory Visit: Payer: Self-pay

## 2021-05-25 ENCOUNTER — Ambulatory Visit: Payer: 59 | Admitting: Family Medicine

## 2021-05-25 ENCOUNTER — Encounter: Payer: Self-pay | Admitting: Family Medicine

## 2021-05-25 VITALS — BP 124/80 | HR 76 | Ht 63.0 in | Wt 187.0 lb

## 2021-05-25 DIAGNOSIS — M7061 Trochanteric bursitis, right hip: Secondary | ICD-10-CM | POA: Diagnosis not present

## 2021-05-25 DIAGNOSIS — G8929 Other chronic pain: Secondary | ICD-10-CM | POA: Diagnosis not present

## 2021-05-25 DIAGNOSIS — M533 Sacrococcygeal disorders, not elsewhere classified: Secondary | ICD-10-CM | POA: Diagnosis not present

## 2021-05-25 DIAGNOSIS — M47817 Spondylosis without myelopathy or radiculopathy, lumbosacral region: Secondary | ICD-10-CM | POA: Diagnosis not present

## 2021-05-25 MED ORDER — DICLOFENAC SODIUM 75 MG PO TBEC: 75.0000 mg | DELAYED_RELEASE_TABLET | Freq: Two times a day (BID) | ORAL | 0 refills | Status: DC

## 2021-05-25 NOTE — Progress Notes (Signed)
Primary Care / Sports Medicine Office Visit  Patient Information:  Patient ID: Stacy Curry, female DOB: 25-Mar-1971 Age: 51 y.o. MRN: [id number]   Stacy Curry is a pleasant 51 y.o. female presenting with the following:  Chief Complaint  Patient presents with   Chronic right SI joint pain    Same; taking meloxicam and Tylenol intermittently with some relief; intolerant of cyclobenzaprine due to excessive drowsiness; 2/10 pain    Patient Active Problem List   Diagnosis Date Noted   Spondylosis of lumbosacral region without myelopathy or radiculopathy 05/25/2021   Chronic right SI joint pain 04/28/2021   Greater trochanteric pain syndrome of right lower extremity 04/28/2021    Vitals:   05/25/21 1547  BP: 124/80  Pulse: 76  SpO2: 97%   Vitals:   05/25/21 1547  Weight: 187 lb (84.8 kg)  Height: 5\' 3"  (1.6 m)   Body mass index is 33.13 kg/m.  DG Lumbar Spine Complete  Result Date: 04/30/2021 CLINICAL DATA:  Chronic right sacroiliac joint pain. Greater trochanteric bursitis. SI joint pain, lumbar pain, deep gluteal pain. EXAM: LUMBAR SPINE - COMPLETE 4+ VIEW COMPARISON:  None. FINDINGS: There are 5 lumbar type vertebra. Minimal broad-based dextroscoliotic curvature. Mild multilevel endplate spurring with disc space narrowing at L3-L4. Moderate facet hypertrophy at L4-L5 and L5-S1. Vertebral body heights are normal. There is no evidence of fracture or focal bone abnormality. Mild degenerative sclerosis about both sacroiliac joints which appears symmetric. No erosive changes. IMPRESSION: 1. Multilevel spondylosis with degenerative disc disease most prominently affecting L3-L4. 2. L4-L5 and L5-S1 facet hypertrophy. 3. Mild degenerative sclerosis about both sacroiliac joints. Electronically Signed   By: Keith Rake M.D.   On: 04/30/2021 16:29   DG Hip Unilat W OR W/O Pelvis 2-3 Views Right  Result Date: 04/30/2021 CLINICAL DATA:  Chronic right sacroiliac joint pain.  Greater trochanteric bursitis. SI joint pain, lumbar pain, deep gluteal pain. EXAM: DG HIP (WITH OR WITHOUT PELVIS) 2-3V RIGHT COMPARISON:  None. FINDINGS: Both hip joint spaces are preserved. The femoral heads are well seated in the respective acetabula. There is mild right acetabular spurring laterally. No evidence of fracture, avascular necrosis, or focal bone abnormality. The pubic rami and remainder of the bony pelvis is intact. Symmetric sclerosis about both sacroiliac joints. No erosive changes are seen. Unremarkable soft tissues. IMPRESSION: 1. Mild right acetabular degenerative spurring laterally. 2. Symmetric bilateral sacroiliac sclerosis, consistent with sacroiliitis. Electronically Signed   By: Keith Rake M.D.   On: 04/30/2021 16:32     Independent interpretation of notes and tests performed by another provider:   Independent interpretation of the x-rays of the lumbar spine and right hip with AP pelvis dated 04/30/2021 are primarily significant for showed changes from L3-S1 with primary focality to L5-S1 where there is subtle narrowing, notable facet hypertrophy and sclerosis, somewhat hyperlordotic curvature noted.  Her right hip and pelvis show sacroiliac sclerosis bilaterally, subtle degenerative changes at the femoral head on the right.  Procedures performed:   None  Pertinent History, Exam, Impression, and Recommendations:   Chronic right SI joint pain Chronic condition that is still demonstrating symptomatology.  Unfortunate patient did not get a chance to dose medications regularly, we reviewed the x-rays which do show lower lumbar degenerative changes and SI joint degeneration.  These findings do correlate with her stated symptomatology.  I have reviewed treatment strategies and she is amenable to a restart of scheduled anti-inflammatories, we will trial diclofenac 75 mg, will hold from  skeletal muscle relaxants due to drowsiness that she experienced.  Plan for formal physical  therapy and follow-up in 4 weeks for reevaluation.  If suboptimal spots noted, ultrasound-guided SI joints /greater trochanter/piriformis/sciatic nerve injections can be considered.  Spondylosis of lumbosacral region without myelopathy or radiculopathy Recent x-rays reveal degenerative changes throughout the lower lumbar spine which can account for a portion (pain with prolonged standing, sitting) of her stated symptomatology. See additional assessment(s) for plan details.  Greater trochanteric pain syndrome of right lower extremity This can be considered secondary/compensatory due to primary degenerative changes around the lumbosacral spine. See additional assessment(s) for plan details.    Orders & Medications Meds ordered this encounter  Medications   diclofenac (VOLTAREN) 75 MG EC tablet    Sig: Take 1 tablet (75 mg total) by mouth 2 (two) times daily.    Dispense:  60 tablet    Refill:  0   Orders Placed This Encounter  Procedures   Ambulatory referral to Physical Therapy     Return in about 4 weeks (around 06/22/2021).     Montel Culver, MD   Primary Care Sports Medicine [place]

## 2021-05-25 NOTE — Assessment & Plan Note (Signed)
Chronic condition that is still demonstrating symptomatology.  Unfortunate patient did not get a chance to dose medications regularly, we reviewed the x-rays which do show lower lumbar degenerative changes and SI joint degeneration.  These findings do correlate with her stated symptomatology.  I have reviewed treatment strategies and she is amenable to a restart of scheduled anti-inflammatories, we will trial diclofenac 75 mg, will hold from skeletal muscle relaxants due to drowsiness that she experienced.  Plan for formal physical therapy and follow-up in 4 weeks for reevaluation.  If suboptimal spots noted, ultrasound-guided SI joints /greater trochanter/piriformis/sciatic nerve injections can be considered.

## 2021-05-25 NOTE — Assessment & Plan Note (Signed)
This can be considered secondary/compensatory due to primary degenerative changes around the lumbosacral spine. See additional assessment(s) for plan details.

## 2021-05-25 NOTE — Assessment & Plan Note (Signed)
Recent x-rays reveal degenerative changes throughout the lower lumbar spine which can account for a portion (pain with prolonged standing, sitting) of her stated symptomatology. See additional assessment(s) for plan details.

## 2021-05-25 NOTE — Patient Instructions (Addendum)
-   Start diclofenac, dose twice daily with food - No other NSAIDs (ibuprofen, naproxen) while on this medication, Tylenol (acetaminophen) okay - Start physical therapy, referral coordinator will contact you for scheduling, can contact number below for expedited scheduling - Perform activity as tolerated using symptoms as a guide, return for follow-up in 4 weeks  [facility]:  [place]:  [phone]

## 2021-06-05 ENCOUNTER — Encounter: Payer: Self-pay | Admitting: Nurse Practitioner

## 2021-06-08 ENCOUNTER — Ambulatory Visit: Payer: 59 | Admitting: Nurse Practitioner

## 2021-06-15 ENCOUNTER — Encounter: Payer: Self-pay | Admitting: Nurse Practitioner

## 2021-06-15 ENCOUNTER — Ambulatory Visit: Payer: 59 | Admitting: Nurse Practitioner

## 2021-06-17 NOTE — Telephone Encounter (Signed)
Please discuss

## 2021-06-18 ENCOUNTER — Other Ambulatory Visit: Payer: Self-pay

## 2021-06-18 ENCOUNTER — Telehealth: Payer: Self-pay

## 2021-06-18 DIAGNOSIS — E1165 Type 2 diabetes mellitus with hyperglycemia: Secondary | ICD-10-CM

## 2021-06-18 MED ORDER — SEMAGLUTIDE 3 MG PO TABS: ORAL_TABLET | ORAL | 2 refills | Status: DC

## 2021-06-18 NOTE — Telephone Encounter (Signed)
Lmom to call us back regarding message she send today for RYBELSUS need clarification

## 2021-06-18 NOTE — Telephone Encounter (Signed)
Pt called back that she still continue see [place] she will call and make appt due to she had new job and also advised her that we send her RYBELSUS

## 2021-06-25 ENCOUNTER — Other Ambulatory Visit: Payer: Self-pay

## 2021-06-25 ENCOUNTER — Encounter: Payer: Self-pay | Admitting: Nurse Practitioner

## 2021-06-25 ENCOUNTER — Telehealth: Payer: Self-pay

## 2021-06-25 NOTE — Telephone Encounter (Signed)
NA

## 2021-06-25 NOTE — Telephone Encounter (Signed)
PA for [facility] sent 06/25/21

## 2021-06-28 ENCOUNTER — Encounter: Payer: Self-pay | Admitting: Family Medicine

## 2021-06-28 ENCOUNTER — Ambulatory Visit: Payer: 59 | Admitting: Family Medicine

## 2021-06-28 ENCOUNTER — Other Ambulatory Visit: Payer: Self-pay

## 2021-06-28 ENCOUNTER — Telehealth: Payer: Self-pay

## 2021-06-28 DIAGNOSIS — M79644 Pain in right finger(s): Secondary | ICD-10-CM

## 2021-06-28 DIAGNOSIS — E1165 Type 2 diabetes mellitus with hyperglycemia: Secondary | ICD-10-CM

## 2021-06-28 MED ORDER — SEMAGLUTIDE 3 MG PO TABS: ORAL_TABLET | ORAL | 2 refills | Status: DC

## 2021-06-28 NOTE — Telephone Encounter (Signed)
RYBELSUS PA Case: [id number], Status: Approved, Coverage Starts on: 06/25/2021 12:00 AM, Coverage Ends on: 06/25/2022 12:00 AM

## 2021-06-28 NOTE — Telephone Encounter (Signed)
Okay to discuss all concerns (hip, back, thumb) at follow-up on 07/09/21 or should patient have separate visits?

## 2021-06-28 NOTE — Telephone Encounter (Signed)
Follow-up on 07/09/21.  We are not patient's PCP.  Please advise.

## 2021-06-28 NOTE — Telephone Encounter (Signed)
received a call from [employer] regarding your PA for the RYBELSUS.  The lady named Crystal told me that it was approved so as soon as I get the fax from her I will send in a new prescription to the pharmacy letting them know it was approved.

## 2021-07-09 ENCOUNTER — Encounter: Payer: Self-pay | Admitting: Family Medicine

## 2021-07-09 ENCOUNTER — Ambulatory Visit
Admission: RE | Admit: 2021-07-09 | Discharge: 2021-07-09 | Disposition: A | Discharge status: Home / Self Care | Payer: Self-pay | Attending: Family Medicine | Admitting: Family Medicine

## 2021-07-09 ENCOUNTER — Other Ambulatory Visit: Payer: Self-pay

## 2021-07-09 ENCOUNTER — Ambulatory Visit
Admission: RE | Admit: 2021-07-09 | Discharge: 2021-07-09 | Disposition: A | Discharge status: Home / Self Care | Payer: Self-pay | Source: Ambulatory Visit | Attending: Family Medicine | Admitting: Family Medicine

## 2021-07-09 ENCOUNTER — Ambulatory Visit (INDEPENDENT_AMBULATORY_CARE_PROVIDER_SITE_OTHER): Payer: 59 | Admitting: Family Medicine

## 2021-07-09 ENCOUNTER — Telehealth: Payer: Self-pay

## 2021-07-09 VITALS — BP 112/80 | HR 78 | Ht 63.0 in | Wt 188.0 lb

## 2021-07-09 DIAGNOSIS — M47817 Spondylosis without myelopathy or radiculopathy, lumbosacral region: Secondary | ICD-10-CM

## 2021-07-09 DIAGNOSIS — M65319 Trigger thumb, unspecified thumb: Secondary | ICD-10-CM | POA: Diagnosis not present

## 2021-07-09 DIAGNOSIS — M79644 Pain in right finger(s): Secondary | ICD-10-CM | POA: Insufficient documentation

## 2021-07-09 DIAGNOSIS — M25551 Pain in right hip: Secondary | ICD-10-CM

## 2021-07-09 DIAGNOSIS — G8929 Other chronic pain: Secondary | ICD-10-CM

## 2021-07-09 DIAGNOSIS — M533 Sacrococcygeal disorders, not elsewhere classified: Secondary | ICD-10-CM

## 2021-07-09 MED ORDER — CELECOXIB 200 MG PO CAPS: 200.0000 mg | ORAL_CAPSULE | Freq: Two times a day (BID) | ORAL | 0 refills | Status: DC

## 2021-07-09 NOTE — Assessment & Plan Note (Signed)
Right-hand-dominant patient presenting with several week history of right diffuse thumb pain, noted stiffening, difficulty with ADLs, no paresthesias, questionable locking/catching described.  Denies any similar symptoms in the past but has had similar symptoms in the contralateral thumb.  Examination findings reveals no abnormalities to inspection, limited range of motion with flexion due to pain, nontender at the radial styloid, negative Finkelstein's, mild/moderate tenderness at the base of the first [place], maximal tenderness that recreates her stated symptoms at the volar MCP where there is nodularity, passive flexion/extension elicits triggering, examination otherwise benign and she is neurovascularly intact throughout the right hand.  Given her clinical findings, radiographs, and overall clinical picture, her symptoms are most consistent with right stenosing tenosynovitis of the thumb, given that she has been on diclofenac, we did discuss escalation to cortisone injection, she is opted to defer this at this stage.  Alternatively, semirigid thumb brace offered, transition from diclofenac to scheduled celecoxib, and close follow-up in 2 weeks time.  If still symptomatic, we will revisit injection, can also further advance to rigid thumb spica brace.

## 2021-07-09 NOTE — Patient Instructions (Signed)
-   Wear thumb brace at all times as much as possible (okay to remove for driving, hand hygiene, bathing, sleeping) - Stop diclofenac - Start Celebrex, dose this twice daily scheduled with food - Return for follow-up in 2 weeks, contact us for any questions between now and then

## 2021-07-09 NOTE — Progress Notes (Signed)
°  ° °  Primary Care / Sports Medicine Office Visit  Patient Information:  Patient ID: Stacy Curry, female DOB: [birth year] Age: 51 y.o. MRN: [id number]   Stacy Curry is a pleasant 51 y.o. female presenting with the following:  Chief Complaint  Patient presents with   Back Pain    Slightly better; doing home exercises on off days from the gym   Hip Pain    Right; same   Thumb pain    X1 month; no known injury or trauma; X-Rays 07/09/21 before appointment; treatments include: heat, ice, compression; describes as aching, twitching, swelling, throbbing; right-handedness; 5/10 pain    Vitals:   07/09/21 1543  BP: 112/80  Pulse: 78  SpO2: 98%   Vitals:   07/09/21 1543  Weight: 188 lb (85.3 kg)  Height: 5\' 3"  (1.6 m)   Body mass index is 33.3 kg/m.  No results found.   Independent interpretation of notes and tests performed by another provider:   Independent interpretation of recent right hand x-rays reveals mild first CMC degenerative changes with osteophyte and joint space narrowing, secondarily minimal at the first MCP, otherwise no acute osseous processes noted.  Procedures performed:   None  Pertinent History, Exam, Impression, and Recommendations:   Stenosing tenosynovitis of thumb Right-hand-dominant patient presenting with several week history of right diffuse thumb pain, noted stiffening, difficulty with ADLs, no paresthesias, questionable locking/catching described.  Denies any similar symptoms in the past but has had similar symptoms in the contralateral thumb.  Examination findings reveals no abnormalities to inspection, limited range of motion with flexion due to pain, nontender at the radial styloid, negative Finkelstein's, mild/moderate tenderness at the base of the first CMC, maximal tenderness that recreates her stated symptoms at the volar MCP where there is nodularity, passive flexion/extension elicits triggering, examination otherwise benign and she is  neurovascularly intact throughout the right hand.  Given her clinical findings, radiographs, and overall clinical picture, her symptoms are most consistent with right stenosing tenosynovitis of the thumb, given that she has been on diclofenac, we did discuss escalation to cortisone injection, she is opted to defer this at this stage.  Alternatively, semirigid thumb brace offered, transition from diclofenac to scheduled celecoxib, and close follow-up in 2 weeks time.  If still symptomatic, we will revisit injection, can also further advance to rigid thumb spica brace.   Orders & Medications Meds ordered this encounter  Medications   celecoxib (CELEBREX) 200 MG capsule    Sig: Take 1 capsule (200 mg total) by mouth 2 (two) times daily.    Dispense:  30 capsule    Refill:  0   No orders of the defined types were placed in this encounter.    Return in about 2 weeks (around 07/23/2021).     09/22/2021, MD   Primary Care Sports Medicine [facility]

## 2021-07-13 ENCOUNTER — Other Ambulatory Visit: Payer: Self-pay

## 2021-07-13 ENCOUNTER — Telehealth: Payer: Self-pay

## 2021-07-13 MED ORDER — OZEMPIC (0.25 OR 0.5 MG/DOSE) 2 MG/3ML ~~LOC~~ SOPN: PEN_INJECTOR | SUBCUTANEOUS | 3 refills | Status: DC

## 2021-07-13 MED ORDER — METFORMIN HCL 500 MG PO TABS: 500.0000 mg | ORAL_TABLET | Freq: Every day | ORAL | 1 refills | Status: DC

## 2021-07-13 NOTE — Telephone Encounter (Signed)
LMOM to inform pt that Stacy Curry is going to try metformin 500 mg once daily and also send OZEMPIC to see if her insurance will cover it.

## 2021-07-13 NOTE — Telephone Encounter (Signed)
error 

## 2021-07-15 ENCOUNTER — Ambulatory Visit: Payer: 59 | Admitting: Nurse Practitioner

## 2021-07-16 ENCOUNTER — Encounter: Payer: Self-pay | Admitting: Nurse Practitioner

## 2021-07-16 ENCOUNTER — Other Ambulatory Visit: Payer: Self-pay

## 2021-07-16 ENCOUNTER — Ambulatory Visit: Payer: 59 | Admitting: Nurse Practitioner

## 2021-07-16 VITALS — BP 112/86 | HR 92 | Temp 98.4°F | Resp 16 | Ht 64.0 in | Wt 187.2 lb

## 2021-07-16 DIAGNOSIS — I10 Essential (primary) hypertension: Secondary | ICD-10-CM

## 2021-07-16 DIAGNOSIS — Z579 Occupational exposure to unspecified risk factor: Secondary | ICD-10-CM | POA: Diagnosis not present

## 2021-07-16 DIAGNOSIS — E1159 Type 2 diabetes mellitus with other circulatory complications: Secondary | ICD-10-CM | POA: Insufficient documentation

## 2021-07-16 DIAGNOSIS — G4452 New daily persistent headache (NDPH): Secondary | ICD-10-CM | POA: Diagnosis not present

## 2021-07-16 DIAGNOSIS — E1165 Type 2 diabetes mellitus with hyperglycemia: Secondary | ICD-10-CM | POA: Diagnosis not present

## 2021-07-16 LAB — POCT GLYCOSYLATED HEMOGLOBIN (HGB A1C): Hemoglobin A1C: 6.2 % — AB (ref 4.0–5.6)

## 2021-07-16 MED ORDER — AMLODIPINE BESYLATE 5 MG PO TABS: 5.0000 mg | ORAL_TABLET | Freq: Every day | ORAL | 1 refills | Status: DC

## 2021-07-16 MED ORDER — OZEMPIC (0.25 OR 0.5 MG/DOSE) 2 MG/3ML ~~LOC~~ SOPN: 0.5000 mg | PEN_INJECTOR | SUBCUTANEOUS | 3 refills | Status: DC

## 2021-07-16 MED ORDER — LOSARTAN POTASSIUM-HCTZ 50-12.5 MG PO TABS: 1.0000 | ORAL_TABLET | Freq: Every day | ORAL | 1 refills | Status: DC

## 2021-07-16 NOTE — Progress Notes (Cosign Needed)
[facility] [address] [place], [place] [address]  Internal MEDICINE  Office Visit Note  Patient Name: Stacy Curry  [id number]  [id number]  Date of Service: 07/16/2021  Chief Complaint  Patient presents with   Follow-up    When sitting at desk back and shoulder pain from hunching over at desk, feet feel tingly sometimes    Diabetes   Hypertension   Diarrhea   Nausea   Headache    HPI Stacy Curry presents for follow-up visit for diabetes, hypertension and back and shoulder pain.  Patient also reports she has a headache almost every day.  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  Patient reports that since switching jobs she has shoulder pain and upper back pain and her feet feel tingly sometimes and she has a headache almost every day.  She reports that her eyes also hurts sometimes.  She has gone from 1 job position where she is not in front of a computer screen hardly at all to sitting in front of a computer screen for most of her workday. --Her A1c has improved and is 6.2 today.  Her previous A1c was 6.7.  She was unable to get Rybelsus approved at an affordable price with her insurance.  She was previously switched to metformin and Ozempic but has not been able to pick up either medication yet. --Today patient is nauseous and has been having some diarrhea.  Patient reports that she thinks it could be a stomach virus.  She does not want any medication for this problem at this time. --She has been seeing Dr. Joseph Berkshire for her back pain and shoulder pain and will be having a follow-up visit with him soon.  She is currently on Celebrex to help decrease inflammation and pain.  At her next visit with him, she is considering steroid injection. --Her blood pressure is well controlled with current medications.    Current  Medication: Outpatient Encounter Medications as of 07/16/2021  Medication Sig   Accu-Chek Softclix Lancets lancets Use as instructed   celecoxib (CELEBREX) 200 MG capsule Take 1 capsule (200 mg total) by mouth 2 (two) times daily.   glucose blood test strip Use as instructed   metFORMIN (GLUCOPHAGE) 500 MG tablet Take 1 tablet (500 mg total) by mouth daily with breakfast.   Multiple Vitamin (MULTIVITAMIN) tablet Take 1 tablet by mouth daily.   Vitamin D, Ergocalciferol, (DRISDOL) 1.25 MG (50000 UNIT) CAPS capsule Take 1 capsule (50,000 Units total) by mouth every 7 (seven) days.   [DISCONTINUED] amLODipine (NORVASC) 5 MG tablet Take 1 tablet (5 mg total) by mouth daily.   [DISCONTINUED] losartan-hydrochlorothiazide (HYZAAR) 50-12.5 MG tablet Take 1 tablet by mouth daily.   [DISCONTINUED] Semaglutide,0.25 or 0.5MG /DOS, (OZEMPIC, 0.25 OR 0.5 MG/DOSE,) 2 MG/3ML SOPN Inject 0.25 mLs into the skin once a week AND 0.5 mLs once a week. For the first week, then go up to 0.5 once a week.   amLODipine (NORVASC) 5 MG tablet Take 1 tablet (5 mg total) by mouth daily.   losartan-hydrochlorothiazide (HYZAAR) 50-12.5 MG tablet Take 1 tablet by mouth daily.   Semaglutide,0.25 or 0.5MG /DOS, (OZEMPIC, 0.25 OR 0.5 MG/DOSE,) 2 MG/3ML SOPN Inject 0.5 mg into the skin once a week. But start at 0.25 mg once a week for the first 4 weeks (doses)   [DISCONTINUED] acetaminophen (TYLENOL) 500 MG  tablet Take 1,000 mg by mouth daily as needed. (Patient not taking: Reported on 07/16/2021)   No facility-administered encounter medications on file as of 07/16/2021.    Surgical History: Past Surgical History:  Procedure Laterality Date   TUBAL LIGATION      Medical History: Past Medical History:  Diagnosis Date   Allergy    Chicken pox age 52   Diabetes mellitus without complication (HCC)    Hypertension     Family History: Family History  Problem Relation Age of Onset   Cancer Mother    Diabetes Father    Heart  attack Maternal Grandmother    Breast cancer Neg Hx     Social History   Socioeconomic History   Marital status: Single    Spouse name: Not on file   Number of children: Not on file   Years of education: Not on file   Highest education level: Not on file  Occupational History   Not on file  Tobacco Use   Smoking status: Some Days    Packs/day: 0.50    Types: Cigarettes, E-cigarettes    Last attempt to quit: 05/09/2021    Years since quitting: 0.1   Smokeless tobacco: Never  Vaping Use   Vaping Use: Never used  Substance and Sexual Activity   Alcohol use: Yes    Comment: 1-2 per month (wine or mixed)   Drug use: Never   Sexual activity: Yes    Partners: Male  Other Topics Concern   Not on file  Social History Narrative   Not on file   Social Determinants of Health   Financial Resource Strain: Not on file  Food Insecurity: Not on file  Transportation Needs: Not on file  Physical Activity: Not on file  Stress: Not on file  Social Connections: Not on file  Intimate Partner Violence: Not on file      Review of Systems  Constitutional: Negative.  Negative for chills, fatigue and unexpected weight change.  HENT:  Negative for congestion, rhinorrhea, sneezing and sore throat.   Eyes:  Negative for redness.  Respiratory: Negative.  Negative for cough, chest tightness, shortness of breath and wheezing.   Cardiovascular: Negative.  Negative for chest pain and palpitations.  Gastrointestinal:  Positive for diarrhea and nausea. Negative for abdominal pain, constipation and vomiting.  Genitourinary:  Negative for dysuria and frequency.  Musculoskeletal:  Positive for arthralgias and back pain. Negative for joint swelling and neck pain.  Skin:  Negative for rash.  Neurological:  Positive for numbness (Numbness and tingling in his feet when sitting in chair at work with her legs hanging dependent) and headaches. Negative for tremors.  Hematological:  Negative for adenopathy.  Does not bruise/bleed easily.  Psychiatric/Behavioral:  Negative for behavioral problems (Depression), sleep disturbance and suicidal ideas. The patient is not nervous/anxious.    Vital Signs: BP 112/86    Pulse 92    Temp 98.4 F (36.9 C)    Resp 16    Ht 5\' 4"  (1.626 m)    Wt 187 lb 3.2 oz (84.9 kg)    SpO2 98%    BMI 32.13 kg/m    Physical Exam Vitals reviewed.  Constitutional:      General: She is not in acute distress.    Appearance: Normal appearance. She is obese. She is not ill-appearing.  HENT:     Head: Normocephalic and atraumatic.  Eyes:     Pupils: Pupils are equal, round, and reactive to light.  Cardiovascular:     Rate and Rhythm: Normal rate and regular rhythm.  Pulmonary:     Effort: Pulmonary effort is normal. No respiratory distress.  Neurological:     Mental Status: She is alert and oriented to person, place, and time.  Psychiatric:        Mood and Affect: Mood normal.        Behavior: Behavior normal.       Assessment/Plan: 1. Type 2 diabetes mellitus with hyperglycemia, without long-term current use of insulin (HCC) A1c is improved and stable at 6.2.  Discontinued Rybelsus.  Ozempic prescription corrected and resent to the pharmacy.  Patient instructed to start Ozempic first if it is available.  If not, patient can pick up metformin from the pharmacy and start that first instead.  Follow-up in 3 months for repeat A1c - POCT HgB A1C - Semaglutide,0.25 or 0.5MG /DOS, (OZEMPIC, 0.25 OR 0.5 MG/DOSE,) 2 MG/3ML SOPN; Inject 0.5 mg into the skin once a week. But start at 0.25 mg once a week for the first 4 weeks (doses)  Dispense: 3 mL; Refill: 3  2. Essential hypertension Blood pressure is stable and well-controlled with current medications, amlodipine and losartan-hydrochlorothiazide refills ordered. - amLODipine (NORVASC) 5 MG tablet; Take 1 tablet (5 mg total) by mouth daily.  Dispense: 90 tablet; Refill: 1 - losartan-hydrochlorothiazide (HYZAAR) 50-12.5 MG  tablet; Take 1 tablet by mouth daily.  Dispense: 90 tablet; Refill: 1  3. New daily persistent headache Patient has been having new daily persistent headaches that seem to be related to her change in job.  She is now having to stare at computer screens for most of her workday which is a significant change for her.  This change may be causing her eyes to strain which could cause a headache or the bluelight from the computer screens could be causing a headache.  Strategies were discussed to help prevent headaches if these are the possible causes.  Patient also will take over-the-counter Advil as needed since it did help alleviate her headache.  4. Occupational exposure to ergonomic risk Increased shoulder pain upper back and neck pain since she started her new job position where she happens to be sitting in front of a computer screen for a more significant amount of time than previous jobs.  Proper posture was discussed in detail as well as using something on the floor to prop her feet up when she is sitting at the computer desk.  Patient is also seeing a sports medicine specialist for chronic pain as well as these new pains and is on prescription medication as well.   General Counseling: Stacy Curry verbalizes understanding of the findings of todays visit and agrees with plan of treatment. I have discussed any further diagnostic evaluation that may be needed or ordered today. We also reviewed her medications today. she has been encouraged to call the office with any questions or concerns that should arise related to todays visit.    Orders Placed This Encounter  Procedures   POCT HgB A1C    Meds ordered this encounter  Medications   amLODipine (NORVASC) 5 MG tablet    Sig: Take 1 tablet (5 mg total) by mouth daily.    Dispense:  90 tablet    Refill:  1   losartan-hydrochlorothiazide (HYZAAR) 50-12.5 MG tablet    Sig: Take 1 tablet by mouth daily.    Dispense:  90 tablet    Refill:  1    Semaglutide,0.25 or 0.5MG /DOS, (OZEMPIC, 0.25  OR 0.5 MG/DOSE,) 2 MG/3ML SOPN    Sig: Inject 0.5 mg into the skin once a week. But start at 0.25 mg once a week for the first 4 weeks (doses)    Dispense:  3 mL    Refill:  3    Fill immediately. Discontinue all previous doses of ozempic    Return in about 3 months (around 10/16/2021) for F/U, Recheck A1C, Stacy Curry PCP.   Total time spent: 30 minutes Time spent includes review of chart, medications, test results, and follow up plan with the patient.   [place] Controlled Substance Database was reviewed by me.  This patient was seen by Sallyanne KusterAlyssa Marylan Glore, FNP-C in collaboration with Dr. Beverely RisenFozia Khan as a part of collaborative care agreement.   Chyrl Elwell R. Tedd SiasAbernathy, MSN, FNP-C Internal medicine

## 2021-07-23 ENCOUNTER — Ambulatory Visit: Payer: 59 | Admitting: Family Medicine

## 2021-08-05 ENCOUNTER — Ambulatory Visit (INDEPENDENT_AMBULATORY_CARE_PROVIDER_SITE_OTHER): Payer: 59 | Admitting: Family Medicine

## 2021-08-05 ENCOUNTER — Inpatient Hospital Stay: Payer: Self-pay | Admitting: Radiology

## 2021-08-05 ENCOUNTER — Encounter: Payer: Self-pay | Admitting: Family Medicine

## 2021-08-05 ENCOUNTER — Other Ambulatory Visit: Payer: Self-pay

## 2021-08-05 VITALS — BP 116/76 | HR 78 | Ht 64.0 in | Wt 187.8 lb

## 2021-08-05 DIAGNOSIS — M65319 Trigger thumb, unspecified thumb: Secondary | ICD-10-CM

## 2021-08-05 DIAGNOSIS — M65311 Trigger thumb, right thumb: Secondary | ICD-10-CM

## 2021-08-05 MED ORDER — TRIAMCINOLONE ACETONIDE 40 MG/ML IJ SUSP: 40.0000 mg | Freq: Once | INTRAMUSCULAR | Status: DC

## 2021-08-05 NOTE — Assessment & Plan Note (Signed)
Patient returns for follow-up to right thumb trigger finger (stenosing tenosynovitis).  At her last visit on 07/09/2021, she was advised to transition to Celebrex and maintain close follow-up.  She states that she did initially dose this in a scheduled manner but has since been dosing as needed and noting progressive pain after this change. ? ?Examination shows focal tenderness at the first MCP, volar aspect, nodule noted, there is swelling, positive triggering.  Given her persistent symptomatology despite treatments thus far, I did revisit thumb spica brace, injections, alternate medications.  She has elected to proceed with ultrasound-guided injection of right thumb trigger finger. ? ?In addition to postinjection care which was outlined today, I have advised continued Celebrex for a few days then transition to as needed dosing, perform home exercises, and contact our office at the 2-week mark or beyond for any persistent symptomatology.  If noted, can consider advanced imaging, immobilization, modification of pharmacotherapy. ? ?

## 2021-08-05 NOTE — Progress Notes (Signed)
?  ? ?  Primary Care / Sports Medicine Office Visit ? ?Patient Information:  ?Patient ID: Stacy Curry, female DOB: [birth year] Age: 51 y.o. MRN: [id number]  ? ?Stacy Curry is a pleasant 51 y.o. female presenting with the following: ? ?Chief Complaint  ?Patient presents with  ? right thumb  ?  Pt here with F/U of right thumb, states that it is feeling better but does have times that it is sharp shooting.   ? ? ?Vitals:  ? 08/05/21 1342  ?BP: 116/76  ?Pulse: 78  ?SpO2: 98%  ? ?Vitals:  ? 08/05/21 1342  ?Weight: 187 lb 12.8 oz (85.2 kg)  ?Height: 5\' 4"  (1.626 m)  ? ?Body mass index is 32.24 kg/m?. ? ?DG Hand Complete Right ? ?Result Date: 07/12/2021 ?CLINICAL DATA:  Right thumb pain, swelling and twitching. EXAM: RIGHT HAND - COMPLETE 3+ VIEW COMPARISON:  None. FINDINGS: Mild thumb carpometacarpal joint space narrowing and peripheral osteophytosis degenerative change. No acute fracture or dislocation. Neutral ulnar variance. IMPRESSION: Mild thumb carpometacarpal osteoarthritis. Electronically Signed   By: 07/14/2021 M.D.   On: 07/12/2021 18:24    ? ?Independent interpretation of notes and tests performed by another provider:  ? ?None ? ?Procedures performed:  ? ?Procedure:  Injection of right first digit trigger finger under ultrasound guidance. ?Ultrasound guidance utilized for out of plane approach to the flexor tendon of the right first digit just superficial to the MCP, there is a focal region of tendon thickening with peritendinous hypoechoic region consistent with tenosynovitis ?Samsung HS60 device utilized with permanent recording / reporting. ?Verbal informed consent obtained and verified. ?Skin prepped in a sterile fashion. ?Ethyl chloride for topical local analgesia.  ?Completed without difficulty and tolerated well. ?Medication: triamcinolone acetonide 40 mg/mL suspension for injection 1 mL total and 1 mL lidocaine 1% without epinephrine utilized for needle placement anesthetic ?Advised to contact for  fevers/chills, erythema, induration, drainage, or persistent bleeding. ? ? ?Pertinent History, Exam, Impression, and Recommendations:  ? ?Stenosing tenosynovitis of thumb ?Patient returns for follow-up to right thumb trigger finger (stenosing tenosynovitis).  At her last visit on 07/09/2021, she was advised to transition to Celebrex and maintain close follow-up.  She states that she did initially dose this in a scheduled manner but has since been dosing as needed and noting progressive pain after this change. ? ?Examination shows focal tenderness at the first MCP, volar aspect, nodule noted, there is swelling, positive triggering.  Given her persistent symptomatology despite treatments thus far, I did revisit thumb spica brace, injections, alternate medications.  She has elected to proceed with ultrasound-guided injection of right thumb trigger finger. ? ?In addition to postinjection care which was outlined today, I have advised continued Celebrex for a few days then transition to as needed dosing, perform home exercises, and contact our office at the 2-week mark or beyond for any persistent symptomatology.  If noted, can consider advanced imaging, immobilization, modification of pharmacotherapy. ?  ? ?Orders & Medications ?Meds ordered this encounter  ?Medications  ? triamcinolone acetonide (KENALOG-40) injection 40 mg  ? ?Orders Placed This Encounter  ?Procedures  ? 07/11/2021 LIMITED JOINT SPACE STRUCTURES UP RIGHT  ?  ? ?No follow-ups on file.  ?  ? ?[place], MD ? ? Primary Care Sports Medicine ?[facility] ?[facility]  ? ?

## 2021-08-05 NOTE — Patient Instructions (Signed)
You have just been given a cortisone injection to reduce pain and inflammation. After the injection you may notice immediate relief of pain as a result of the Lidocaine. It is important to rest the area of the injection for 24 to 48 hours after the injection. There is a possibility of some temporary increased discomfort and swelling for up to 72 hours until the cortisone begins to work. If you do have pain, simply rest the joint and use ice. If you can tolerate over the counter medications, you can try Tylenol, Aleve, or Advil for added relief per package instructions. ?- As above, relative rest x2 days then gradual return to normal activity ?- Recommend continuing Celebrex x3 days then transition to as needed dosing ?- Contact our office at the 2-week mark or beyond for any residual symptoms at the right thumb and/or low back ?- Otherwise, follow-up as needed ?

## 2021-10-15 ENCOUNTER — Ambulatory Visit: Payer: 59 | Admitting: Nurse Practitioner

## 2021-10-15 ENCOUNTER — Encounter: Payer: Self-pay | Admitting: Nurse Practitioner

## 2021-10-15 VITALS — BP 137/83 | HR 95 | Temp 98.6°F | Resp 16 | Ht 63.5 in | Wt 187.4 lb

## 2021-10-15 DIAGNOSIS — E1165 Type 2 diabetes mellitus with hyperglycemia: Secondary | ICD-10-CM

## 2021-10-15 DIAGNOSIS — I10 Essential (primary) hypertension: Secondary | ICD-10-CM | POA: Diagnosis not present

## 2021-10-15 DIAGNOSIS — R600 Localized edema: Secondary | ICD-10-CM

## 2021-10-15 DIAGNOSIS — I700 Atherosclerosis of aorta: Secondary | ICD-10-CM

## 2021-10-15 MED ORDER — HYDROCHLOROTHIAZIDE 12.5 MG PO TABS: 12.5000 mg | ORAL_TABLET | Freq: Every day | ORAL | 2 refills | Status: DC | PRN

## 2021-10-15 NOTE — Progress Notes (Signed)
[facility] [address] [place], [place] [address]  Internal MEDICINE  Office Visit Note  Patient Name: Stacy Curry  [id number]  [id number]  Date of Service: 10/15/2021  Chief Complaint  Patient presents with   Follow-up    Left ankle swells up sometimes   Diabetes   Hypertension    HPI Stacy Curry presents for a follow up visit for hypertension, lower extremity edema, and diabetes. Her blood pressure is stable today with current medication but she is still having swelling in her lower left leg/ankle.  Her A1C is 6.1 today which is slightly improved from 6.2.  She is diet controlled, and is not on any diabetic medication as this time.      Current Medication: Outpatient Encounter Medications as of 10/15/2021  Medication Sig   Accu-Chek Softclix Lancets lancets Use as instructed   amLODipine (NORVASC) 5 MG tablet Take 1 tablet (5 mg total) by mouth daily.   celecoxib (CELEBREX) 200 MG capsule Take 1 capsule (200 mg total) by mouth 2 (two) times daily.   glucose blood test strip Use as instructed   hydrochlorothiazide (HYDRODIURIL) 12.5 MG tablet Take 1 tablet (12.5 mg total) by mouth daily as needed (lower extremity swelling/fluid retention.). Only take in the morning   losartan-hydrochlorothiazide (HYZAAR) 50-12.5 MG tablet Take 1 tablet by mouth daily.   Multiple Vitamin (MULTIVITAMIN) tablet Take 1 tablet by mouth daily.   Vitamin D, Ergocalciferol, (DRISDOL) 1.25 MG (50000 UNIT) CAPS capsule Take 1 capsule (50,000 Units total) by mouth every 7 (seven) days.   [DISCONTINUED] Semaglutide,0.25 or 0.5MG /DOS, (OZEMPIC, 0.25 OR 0.5 MG/DOSE,) 2 MG/3ML SOPN Inject 0.5 mg into the skin once a week. But start at 0.25 mg once a week for the first 4 weeks (doses) (Patient not taking: Reported on 08/05/2021)   Facility-Administered Encounter Medications as of 10/15/2021  Medication   triamcinolone acetonide (KENALOG-40) injection 40 mg    Surgical History: Past Surgical History:   Procedure Laterality Date   TUBAL LIGATION      Medical History: Past Medical History:  Diagnosis Date   Allergy    Chicken pox age 24   Diabetes mellitus without complication (HCC)    Hypertension     Family History: Family History  Problem Relation Age of Onset   Cancer Mother    Diabetes Father    Heart attack Maternal Grandmother    Breast cancer Neg Hx     Social History   Socioeconomic History   Marital status: Single    Spouse name: Not on file   Number of children: Not on file   Years of education: Not on file   Highest education level: Not on file  Occupational History   Not on file  Tobacco Use   Smoking status: Some Days    Packs/day: 0.50    Types: Cigarettes, E-cigarettes    Last attempt to quit: 05/09/2021    Years since quitting: 0.5   Smokeless tobacco: Never  Vaping Use   Vaping Use: Never used  Substance and Sexual Activity   Alcohol use: Yes    Comment: 1-2 per month (wine or mixed)   Drug use: Never   Sexual activity: Yes    Partners: Male  Other Topics Concern   Not on file  Social History Narrative   Not on file   Social Determinants of Health   Financial Resource Strain: Not on file  Food Insecurity: Not on file  Transportation Needs: Not on file  Physical Activity:  Not on file  Stress: Not on file  Social Connections: Not on file  Intimate Partner Violence: Not on file      Review of Systems  Constitutional:  Negative for chills, fatigue and unexpected weight change.  HENT:  Negative for congestion, rhinorrhea, sneezing and sore throat.   Eyes:  Negative for redness.  Respiratory:  Negative for cough, chest tightness and shortness of breath.   Cardiovascular:  Negative for chest pain and palpitations.  Gastrointestinal:  Negative for abdominal pain, constipation, diarrhea, nausea and vomiting.  Genitourinary:  Negative for dysuria and frequency.  Musculoskeletal:  Negative for arthralgias, back pain, joint swelling and  neck pain.  Skin:  Negative for rash.  Neurological: Negative.  Negative for tremors and numbness.  Hematological:  Negative for adenopathy. Does not bruise/bleed easily.  Psychiatric/Behavioral:  Negative for behavioral problems (Depression), sleep disturbance and suicidal ideas. The patient is not nervous/anxious.     Vital Signs: BP 137/83   Pulse 95   Temp 98.6 F (37 C)   Resp 16   Ht 5' 3.5" (1.613 m)   Wt 187 lb 6.4 oz (85 kg)   SpO2 98%   BMI 32.68 kg/m    Physical Exam Vitals reviewed.  Constitutional:      General: She is not in acute distress.    Appearance: Normal appearance. She is obese. She is not ill-appearing.  HENT:     Head: Normocephalic and atraumatic.  Eyes:     Pupils: Pupils are equal, round, and reactive to light.  Cardiovascular:     Rate and Rhythm: Normal rate and regular rhythm.  Pulmonary:     Effort: Pulmonary effort is normal. No respiratory distress.  Neurological:     Mental Status: She is alert and oriented to person, place, and time.  Psychiatric:        Mood and Affect: Mood normal.        Behavior: Behavior normal.        Assessment/Plan: 1. Type 2 diabetes mellitus with hyperglycemia, without long-term current use of insulin (HCC) Continuingto improve, prediabetic level at 6.1 right now. Continue diet and lifestyle modifications.  - POCT HgB A1C  2. Essential hypertension Additional HCTZ added to medication regimen but only as needed - hydrochlorothiazide (HYDRODIURIL) 12.5 MG tablet; Take 1 tablet (12.5 mg total) by mouth daily as needed (lower extremity swelling/fluid retention.). Only take in the morning  Dispense: 30 tablet; Refill: 2  3. Lower extremity edema Additional HCTZ added only as needed for lower extremity swelling and fluid retention.  - hydrochlorothiazide (HYDRODIURIL) 12.5 MG tablet; Take 1 tablet (12.5 mg total) by mouth daily as needed (lower extremity swelling/fluid retention.). Only take in the morning   Dispense: 30 tablet; Refill: 2  4. Aortic atherosclerosis (HCC) Not currently taking any statin medication or other cholesterol lowering medication, will discuss at next office visit. May need to recheck cholesterol panel too.    General Counseling: Taraneh verbalizes understanding of the findings of todays visit and agrees with plan of treatment. I have discussed any further diagnostic evaluation that may be needed or ordered today. We also reviewed her medications today. she has been encouraged to call the office with any questions or concerns that should arise related to todays visit.    Orders Placed This Encounter  Procedures   POCT HgB A1C    Meds ordered this encounter  Medications   hydrochlorothiazide (HYDRODIURIL) 12.5 MG tablet    Sig: Take 1 tablet (12.5 mg  total) by mouth daily as needed (lower extremity swelling/fluid retention.). Only take in the morning    Dispense:  30 tablet    Refill:  2    This new prescription is in addition to current BP medications. Please fill today    Return in about 3 months (around 01/15/2022) for F/U, Recheck A1C, Jemiah Ellenburg PCP.   Total time spent:30 Minutes Time spent includes review of chart, medications, test results, and follow up plan with the patient.   [place] Controlled Substance Database was reviewed by me.  This patient was seen by Sallyanne Kuster, FNP-C in collaboration with Dr. Beverely Risen as a part of collaborative care agreement.   Kimm Sider R. Tedd Sias, MSN, FNP-C Internal medicine

## 2021-11-02 ENCOUNTER — Encounter: Payer: Self-pay | Admitting: Nurse Practitioner

## 2021-11-03 ENCOUNTER — Other Ambulatory Visit: Payer: Self-pay

## 2021-11-03 MED ORDER — PREDNISONE 10 MG (21) PO TBPK: 10.0000 mg | ORAL_TABLET | Freq: Every day | ORAL | 0 refills | Status: DC

## 2021-11-03 NOTE — Telephone Encounter (Signed)
Per Alyssa I sent pred taper for 6 days.  Patient aware

## 2021-11-05 ENCOUNTER — Encounter: Payer: Self-pay | Admitting: Nurse Practitioner

## 2021-12-02 ENCOUNTER — Encounter: Payer: Self-pay | Admitting: Nurse Practitioner

## 2021-12-02 LAB — POCT GLYCOSYLATED HEMOGLOBIN (HGB A1C): Hemoglobin A1C: 6.1 % — AB (ref 4.5–5.6)

## 2021-12-14 ENCOUNTER — Encounter: Payer: Self-pay | Admitting: Nurse Practitioner

## 2021-12-16 ENCOUNTER — Encounter: Payer: Self-pay | Admitting: Physician Assistant

## 2021-12-16 ENCOUNTER — Ambulatory Visit: Payer: 59 | Admitting: Physician Assistant

## 2021-12-16 VITALS — BP 132/81 | HR 84 | Temp 98.2°F | Resp 16 | Ht 63.5 in | Wt 192.0 lb

## 2021-12-16 DIAGNOSIS — R300 Dysuria: Secondary | ICD-10-CM | POA: Diagnosis not present

## 2021-12-16 DIAGNOSIS — N898 Other specified noninflammatory disorders of vagina: Secondary | ICD-10-CM | POA: Diagnosis not present

## 2021-12-16 LAB — POCT URINALYSIS DIPSTICK
Bilirubin, UA: NEGATIVE
Glucose, UA: NEGATIVE
Nitrite, UA: NEGATIVE
Protein, UA: NEGATIVE
Spec Grav, UA: 1.015 (ref 1.010–1.025)
Urobilinogen, UA: 0.2 E.U./dL
pH, UA: 7 (ref 5.0–8.0)

## 2021-12-16 MED ORDER — FLUCONAZOLE 150 MG PO TABS: ORAL_TABLET | ORAL | 0 refills | Status: DC

## 2021-12-16 NOTE — Progress Notes (Signed)
[facility] [address] [place], [place] [address]  Internal MEDICINE  Office Visit Note  Patient Name: Stacy Curry  [id number]  [id number]  Date of Service: 12/16/2021  Chief Complaint  Patient presents with   Vaginal Itching    Using new lavender scented toilet paper - no discharge or discomfort, just itching     HPI Pt is here for a sick visit. -She is have vaginal itching -Thinks irritation is from new lavender scented toilet paper. No other changes. Denies any discharge or pain. No unprotected sex, no urinary symptoms -No urinary symptoms or abdominal pressure, but dip does show some leukocytes. Will send for culture. -Stress from daughter and is impacting her sleep. Starts ruminating. Will try active thinking techniques and may try melatonin  Current Medication:  Outpatient Encounter Medications as of 12/16/2021  Medication Sig   Accu-Chek Softclix Lancets lancets Use as instructed   amLODipine (NORVASC) 5 MG tablet Take 1 tablet (5 mg total) by mouth daily.   celecoxib (CELEBREX) 200 MG capsule Take 1 capsule (200 mg total) by mouth 2 (two) times daily.   fluconazole (DIFLUCAN) 150 MG tablet Take 1 tablet by mouth once. May repeat after 3 days if symptoms continue.   glucose blood test strip Use as instructed   hydrochlorothiazide (HYDRODIURIL) 12.5 MG tablet Take 1 tablet (12.5 mg total) by mouth daily as needed (lower extremity swelling/fluid retention.). Only take in the morning   losartan-hydrochlorothiazide (HYZAAR) 50-12.5 MG tablet Take 1 tablet by mouth daily.   Multiple Vitamin (MULTIVITAMIN) tablet Take 1 tablet by mouth daily.   predniSONE (STERAPRED UNI-PAK 21 TAB) 10 MG (21) TBPK tablet Take 1 tablet (10 mg total) by mouth daily. Take as directed for 6 days   Vitamin D, Ergocalciferol, (DRISDOL) 1.25 MG (50000 UNIT) CAPS capsule Take 1 capsule (50,000 Units total) by mouth every 7 (seven) days.   Facility-Administered Encounter Medications as  of 12/16/2021  Medication   triamcinolone acetonide (KENALOG-40) injection 40 mg      Medical History: Past Medical History:  Diagnosis Date   Allergy    Chicken pox age 58   Diabetes mellitus without complication (HCC)    Hypertension      Vital Signs: BP 132/81   Pulse 84   Temp 98.2 F (36.8 C)   Resp 16   Ht 5' 3.5" (1.613 m)   Wt 192 lb (87.1 kg)   SpO2 99%   BMI 33.48 kg/m    Review of Systems  Constitutional:  Negative for fatigue and fever.  HENT:  Negative for congestion, mouth sores and postnasal drip.   Respiratory:  Negative for cough.   Cardiovascular:  Negative for chest pain.  Genitourinary:  Negative for flank pain, pelvic pain, urgency, vaginal discharge and vaginal pain.       Vaginal itching  Psychiatric/Behavioral:  Positive for sleep disturbance.     Physical Exam Vitals reviewed.  Constitutional:      General: She is not in acute distress.    Appearance: Normal appearance. She is obese. She is not ill-appearing.  HENT:     Head: Normocephalic and atraumatic.  Eyes:     Pupils: Pupils are equal, round, and reactive to light.  Cardiovascular:     Rate and Rhythm: Normal rate and regular rhythm.  Pulmonary:     Effort: Pulmonary effort is normal. No respiratory distress.  Abdominal:     Tenderness: There is no abdominal tenderness. There is no right CVA tenderness or  left CVA tenderness.  Skin:    General: Skin is warm and dry.  Neurological:     Mental Status: She is alert and oriented to person, place, and time.  Psychiatric:        Mood and Affect: Mood normal.        Behavior: Behavior normal.       Assessment/Plan: 1. Vaginal irritation Will start diflucan, call office if not improving or if new symptoms - fluconazole (DIFLUCAN) 150 MG tablet; Take 1 tablet by mouth once. May repeat after 3 days if symptoms continue.  Dispense: 3 tablet; Refill: 0  2. Dysuria Dip shows leukocytes, but denies urinary symptoms. Will send for  culture and consider starting ABX pending results. - POCT Urinalysis Dipstick - CULTURE, URINE COMPREHENSIVE   General Counseling: Stacy Curry verbalizes understanding of the findings of todays visit and agrees with plan of treatment. I have discussed any further diagnostic evaluation that may be needed or ordered today. We also reviewed her medications today. she has been encouraged to call the office with any questions or concerns that should arise related to todays visit.    Counseling:    Orders Placed This Encounter  Procedures   CULTURE, URINE COMPREHENSIVE   POCT Urinalysis Dipstick    Meds ordered this encounter  Medications   fluconazole (DIFLUCAN) 150 MG tablet    Sig: Take 1 tablet by mouth once. May repeat after 3 days if symptoms continue.    Dispense:  3 tablet    Refill:  0    Time spent:30 Minutes

## 2021-12-19 LAB — CULTURE, URINE COMPREHENSIVE

## 2021-12-20 ENCOUNTER — Telehealth: Payer: Self-pay

## 2021-12-20 NOTE — Telephone Encounter (Signed)
Left message for patient regarding urine results and that she does not need any antibiotics.

## 2021-12-20 NOTE — Telephone Encounter (Signed)
-----   Message from Carlean Jews, PA-C sent at 12/20/2021  9:47 AM EDT ----- Please let her know that her urine culture only showed mixed urogenital flora which usually does not require treatment with antibiotic

## 2022-01-10 ENCOUNTER — Encounter: Payer: Self-pay | Admitting: Nurse Practitioner

## 2022-01-13 ENCOUNTER — Ambulatory Visit (INDEPENDENT_AMBULATORY_CARE_PROVIDER_SITE_OTHER): Payer: 59 | Admitting: Nurse Practitioner

## 2022-01-13 ENCOUNTER — Encounter: Payer: Self-pay | Admitting: Nurse Practitioner

## 2022-01-13 VITALS — BP 130/85 | HR 84 | Temp 98.3°F | Resp 16 | Ht 63.5 in | Wt 191.2 lb

## 2022-01-13 DIAGNOSIS — N3001 Acute cystitis with hematuria: Secondary | ICD-10-CM | POA: Diagnosis not present

## 2022-01-13 DIAGNOSIS — E1165 Type 2 diabetes mellitus with hyperglycemia: Secondary | ICD-10-CM | POA: Diagnosis not present

## 2022-01-13 DIAGNOSIS — I10 Essential (primary) hypertension: Secondary | ICD-10-CM

## 2022-01-13 DIAGNOSIS — N939 Abnormal uterine and vaginal bleeding, unspecified: Secondary | ICD-10-CM

## 2022-01-13 DIAGNOSIS — R300 Dysuria: Secondary | ICD-10-CM | POA: Diagnosis not present

## 2022-01-13 DIAGNOSIS — Z113 Encounter for screening for infections with a predominantly sexual mode of transmission: Secondary | ICD-10-CM

## 2022-01-13 DIAGNOSIS — R69 Illness, unspecified: Secondary | ICD-10-CM | POA: Diagnosis not present

## 2022-01-13 LAB — POCT GLYCOSYLATED HEMOGLOBIN (HGB A1C): Hemoglobin A1C: 6.6 % — AB (ref 4.0–5.6)

## 2022-01-13 LAB — POCT URINALYSIS DIPSTICK
Bilirubin, UA: NEGATIVE
Glucose, UA: NEGATIVE
Ketones, UA: NEGATIVE
Nitrite, UA: NEGATIVE
Protein, UA: NEGATIVE
Spec Grav, UA: 1.015 (ref 1.010–1.025)
Urobilinogen, UA: 0.2 E.U./dL
pH, UA: 7 (ref 5.0–8.0)

## 2022-01-13 MED ORDER — AMLODIPINE BESYLATE 5 MG PO TABS: 5.0000 mg | ORAL_TABLET | Freq: Every day | ORAL | 1 refills | Status: DC

## 2022-01-13 MED ORDER — NITROFURANTOIN MONOHYD MACRO 100 MG PO CAPS: 100.0000 mg | ORAL_CAPSULE | Freq: Two times a day (BID) | ORAL | 0 refills | Status: AC

## 2022-01-13 MED ORDER — LOSARTAN POTASSIUM-HCTZ 50-12.5 MG PO TABS: 1.0000 | ORAL_TABLET | Freq: Every day | ORAL | 1 refills | Status: DC

## 2022-01-13 NOTE — Progress Notes (Signed)
[facility] [address] [place], [place] [address]  Internal MEDICINE  Office Visit Note  Patient Name: Stacy Curry  [id number]  [id number]  Date of Service: 01/13/2022  Chief Complaint  Patient presents with   Follow-up   Diabetes   Hypertension   Vaginal Bleeding    Spotting but not a lot daily and has a smell that is very bad.  Same partner for 2 years wants to do STD testing    HPI Stacy Curry presents for a follow up visit for diabetes, hypertension and vaginal bleeding/spotting Hypertension -- controlled with amlodipine 5 mg and losartan-hydrochlorothiazide 50-12.5 mg. She also has an extra 12.5 mg of HCTZ that she can take if she has increased fluid retention or edema. T2DM -- a1c is 6.6 today, patient had a prednisone taper in June which may have elevated her glucose levels. No weight gain.  Vaginal bleeding and spotting x3 weeks, mostly spotting. Reports abnormal vaginal odor and some brown vaginal discharge. Wants to be tested for STDs. Performed self swab for STDs. Urine specimen provided for urinalysis    Current Medication: Outpatient Encounter Medications as of 01/13/2022  Medication Sig   Accu-Chek Softclix Lancets lancets Use as instructed   fluconazole (DIFLUCAN) 150 MG tablet Take 1 tablet by mouth once. May repeat after 3 days if symptoms continue.   glucose blood test strip Use as instructed   hydrochlorothiazide (HYDRODIURIL) 12.5 MG tablet Take 1 tablet (12.5 mg total) by mouth daily as needed (lower extremity swelling/fluid retention.). Only take in the morning   Multiple Vitamin (MULTIVITAMIN) tablet Take 1 tablet by mouth daily.   [EXPIRED] nitrofurantoin, macrocrystal-monohydrate, (MACROBID) 100 MG capsule Take 1 capsule (100 mg total) by mouth 2 (two) times daily for 7 days.   Vitamin D, Ergocalciferol, (DRISDOL) 1.25 MG (50000 UNIT) CAPS capsule Take 1 capsule (50,000 Units total) by mouth every 7 (seven) days.   [DISCONTINUED] amLODipine  (NORVASC) 5 MG tablet Take 1 tablet (5 mg total) by mouth daily.   [DISCONTINUED] celecoxib (CELEBREX) 200 MG capsule Take 1 capsule (200 mg total) by mouth 2 (two) times daily.   [DISCONTINUED] losartan-hydrochlorothiazide (HYZAAR) 50-12.5 MG tablet Take 1 tablet by mouth daily.   amLODipine (NORVASC) 5 MG tablet Take 1 tablet (5 mg total) by mouth daily.   losartan-hydrochlorothiazide (HYZAAR) 50-12.5 MG tablet Take 1 tablet by mouth daily.   [DISCONTINUED] predniSONE (STERAPRED UNI-PAK 21 TAB) 10 MG (21) TBPK tablet Take 1 tablet (10 mg total) by mouth daily. Take as directed for 6 days   Facility-Administered Encounter Medications as of 01/13/2022  Medication   triamcinolone acetonide (KENALOG-40) injection 40 mg    Surgical History: Past Surgical History:  Procedure Laterality Date   TUBAL LIGATION      Medical History: Past Medical History:  Diagnosis Date   Allergy    Chicken pox age 6   Diabetes mellitus without complication (HCC)    Hypertension     Family History: Family History  Problem Relation Age of Onset   Cancer Mother    Diabetes Father    Heart attack Maternal Grandmother    Breast cancer Neg Hx     Social History   Socioeconomic History   Marital status: Single    Spouse name: Not on file   Number of children: Not on file   Years of education: Not on file   Highest education level: Not on file  Occupational History   Not on file  Tobacco Use   Smoking  status: Some Days    Packs/day: 0.50    Types: Cigarettes, E-cigarettes    Last attempt to quit: 05/09/2021    Years since quitting: 0.7   Smokeless tobacco: Never  Vaping Use   Vaping Use: Never used  Substance and Sexual Activity   Alcohol use: Yes    Comment: 1-2 per month (wine or mixed)   Drug use: Never   Sexual activity: Yes    Partners: Male  Other Topics Concern   Not on file  Social History Narrative   Not on file   Social Determinants of Health   Financial Resource Strain:  Not on file  Food Insecurity: Not on file  Transportation Needs: Not on file  Physical Activity: Not on file  Stress: Not on file  Social Connections: Not on file  Intimate Partner Violence: Not on file      Review of Systems  Constitutional:  Negative for appetite change, fatigue and unexpected weight change.  HENT: Negative.    Respiratory:  Negative for cough, chest tightness, shortness of breath and wheezing.   Cardiovascular: Negative.  Negative for chest pain and palpitations.  Gastrointestinal: Negative.  Negative for abdominal pain, blood in stool, diarrhea, nausea and vomiting.  Endocrine: Negative for polydipsia, polyphagia and polyuria.  Genitourinary:  Positive for menstrual problem, vaginal bleeding and vaginal discharge. Negative for frequency and pelvic pain.       Vaginal odor  Musculoskeletal: Negative.   Skin: Negative.     Vital Signs: BP 130/85   Pulse 84   Temp 98.3 F (36.8 C)   Resp 16   Ht 5' 3.5" (1.613 m)   Wt 191 lb 3.2 oz (86.7 kg)   SpO2 95%   BMI 33.34 kg/m    Physical Exam Vitals reviewed.  Constitutional:      General: She is not in acute distress.    Appearance: Normal appearance. She is obese. She is not ill-appearing.  HENT:     Head: Normocephalic and atraumatic.  Eyes:     Pupils: Pupils are equal, round, and reactive to light.  Cardiovascular:     Rate and Rhythm: Normal rate and regular rhythm.  Pulmonary:     Effort: Pulmonary effort is normal. No respiratory distress.  Neurological:     Mental Status: She is alert and oriented to person, place, and time.  Psychiatric:        Mood and Affect: Mood normal.        Behavior: Behavior normal.        Assessment/Plan: 1. Type 2 diabetes mellitus with hyperglycemia, without long-term current use of insulin (HCC) Flonnie does not want to take medications and would like to continue to work hard on diet and lifestyle modifications as she has been. She also had a prednisone  taper which can increase glucose levels.  - POCT HgB A1C  2. Essential hypertension Controlled with current medications, no changes - amLODipine (NORVASC) 5 MG tablet; Take 1 tablet (5 mg total) by mouth daily.  Dispense: 90 tablet; Refill: 1 - losartan-hydrochlorothiazide (HYZAAR) 50-12.5 MG tablet; Take 1 tablet by mouth daily.  Dispense: 90 tablet; Refill: 1  3. Vaginal spotting Urinalysis done, culture sent, nuswab sent to rule out STDs, BV or candidiasis. Also discussed with patient that this could be an irregularity due to fluctuating hormone levels related to menopause.   4. Acute cystitis with hematuria Empiric antibiotic treatment prescribed, urine had blood and leukocytes. Culture sent, results in process - nitrofurantoin, macrocrystal-monohydrate, (  MACROBID) 100 MG capsule; Take 1 capsule (100 mg total) by mouth 2 (two) times daily for 7 days.  Dispense: 14 capsule; Refill: 0  5. Dysuria Urine positive for blood and leukocytes, culture sent - POCT Urinalysis Dipstick  6. Screen for STD (sexually transmitted disease) Patient performed vaginal self-swab. Screening for STDs per patient request due to spotting and abnormal odor and some discharge.  - NuSwab Vaginitis Plus (VG+)   General Counseling: Marylan verbalizes understanding of the findings of todays visit and agrees with plan of treatment. I have discussed any further diagnostic evaluation that may be needed or ordered today. We also reviewed her medications today. she has been encouraged to call the office with any questions or concerns that should arise related to todays visit.    Orders Placed This Encounter  Procedures   CULTURE, URINE COMPREHENSIVE   NuSwab Vaginitis Plus (VG+)   POCT HgB A1C   POCT Urinalysis Dipstick    Meds ordered this encounter  Medications   amLODipine (NORVASC) 5 MG tablet    Sig: Take 1 tablet (5 mg total) by mouth daily.    Dispense:  90 tablet    Refill:  1    losartan-hydrochlorothiazide (HYZAAR) 50-12.5 MG tablet    Sig: Take 1 tablet by mouth daily.    Dispense:  90 tablet    Refill:  1   nitrofurantoin, macrocrystal-monohydrate, (MACROBID) 100 MG capsule    Sig: Take 1 capsule (100 mg total) by mouth 2 (two) times daily for 7 days.    Dispense:  14 capsule    Refill:  0    Fill script today    Return in about 3 months (around 04/14/2022) for F/U, Recheck A1C, Eyonna Sandstrom PCP.   Total time spent:30 Minutes Time spent includes review of chart, medications, test results, and follow up plan with the patient.   [place] Controlled Substance Database was reviewed by me.  This patient was seen by Sallyanne Kuster, FNP-C in collaboration with Dr. Beverely Risen as a part of collaborative care agreement.   Charlott Calvario R. Tedd Sias, MSN, FNP-C Internal medicine

## 2022-01-16 LAB — NUSWAB VAGINITIS PLUS (VG+)
Candida albicans, NAA: NEGATIVE
Candida glabrata, NAA: NEGATIVE
Chlamydia trachomatis, NAA: NEGATIVE
Neisseria gonorrhoeae, NAA: NEGATIVE
Trich vag by NAA: POSITIVE — AB

## 2022-01-16 NOTE — Progress Notes (Signed)
Positive for trich, please call the patient and send a prescription for metronidazole 500 mg PO twice daily x7 days, #14 tablets, 0 refills.  Continue nitrofurantoin, culture is still pending

## 2022-01-17 ENCOUNTER — Other Ambulatory Visit: Payer: Self-pay

## 2022-01-17 MED ORDER — METRONIDAZOLE 500 MG PO TABS: 500.0000 mg | ORAL_TABLET | Freq: Two times a day (BID) | ORAL | 0 refills | Status: DC

## 2022-01-17 NOTE — Telephone Encounter (Signed)
Pt informed of lab results and that flagyl was sent to pharmacy and pt informed that partner will need to be treated and also not to drink alcohol while taking the flagyl.  Pt understood and also will be contacted once culture comes in and to continue Macrobid til then

## 2022-01-18 LAB — CULTURE, URINE COMPREHENSIVE

## 2022-02-22 ENCOUNTER — Encounter: Payer: 59 | Admitting: Nurse Practitioner

## 2022-03-02 NOTE — Telephone Encounter (Signed)
done

## 2022-03-07 ENCOUNTER — Telehealth: Payer: Self-pay | Admitting: Nurse Practitioner

## 2022-03-07 NOTE — Telephone Encounter (Signed)
Left vm and sent mychart message to confirm 03/11/22 appointment-Toni

## 2022-03-10 ENCOUNTER — Encounter: Payer: Self-pay | Admitting: Nurse Practitioner

## 2022-03-11 ENCOUNTER — Encounter: Payer: 59 | Admitting: Nurse Practitioner

## 2022-03-17 ENCOUNTER — Encounter: Payer: Self-pay | Admitting: Nurse Practitioner

## 2022-03-21 ENCOUNTER — Encounter: Payer: Self-pay | Admitting: Nurse Practitioner

## 2022-03-21 MED ORDER — AZITHROMYCIN 250 MG PO TABS: ORAL_TABLET | ORAL | 0 refills | Status: AC

## 2022-03-21 MED ORDER — PREDNISONE 10 MG PO TABS: ORAL_TABLET | ORAL | 0 refills | Status: DC

## 2022-03-28 IMAGING — CT CT RENAL STONE PROTOCOL
2 of 4 series · 16 of 46 positions shown, 18 images · non-contrast
Comparison: None.

CLINICAL DATA: Left-sided flank pain since [REDACTED], pain radiating
to upper abdomen

EXAM:
CT ABDOMEN AND PELVIS WITHOUT CONTRAST
TECHNIQUE: Multidetector CT imaging of the abdomen and pelvis was performed
following the standard protocol without IV contrast.

[Series 2: stone full standard · axial · 0.63mm/px · z∈[-456,-71]mm · 13 of 85 slices shown, 15 images]
[im 4/85  soft-tissue]
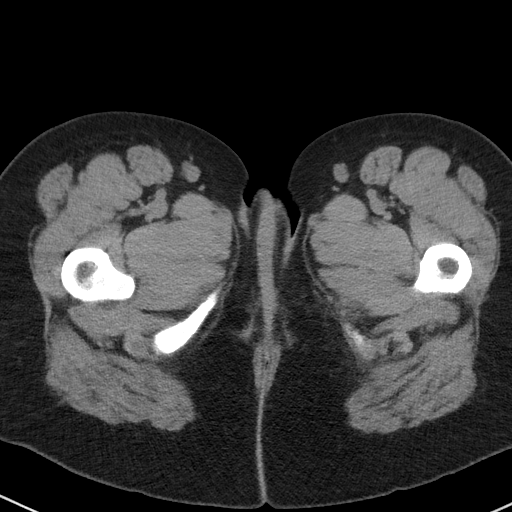
[im 4/85  bone]
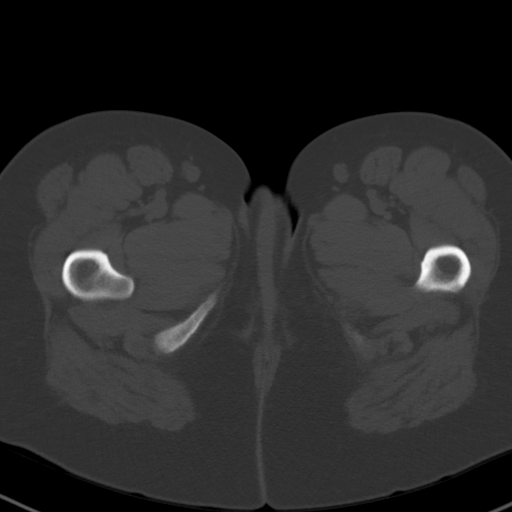
[im 11/85  soft-tissue]
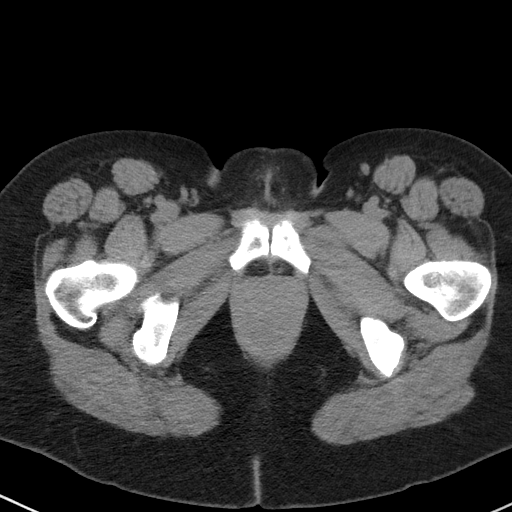
[im 17/85  soft-tissue]
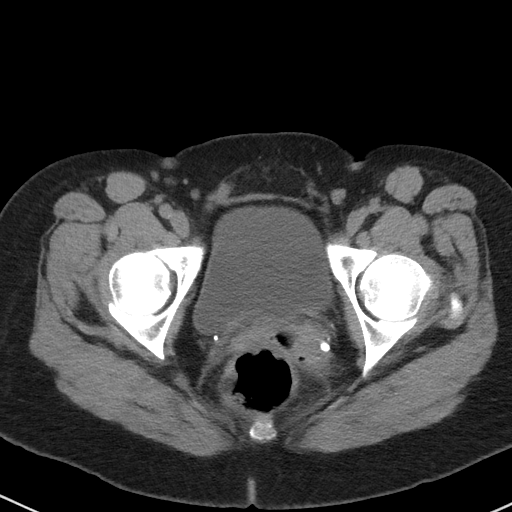
[im 24/85  soft-tissue]
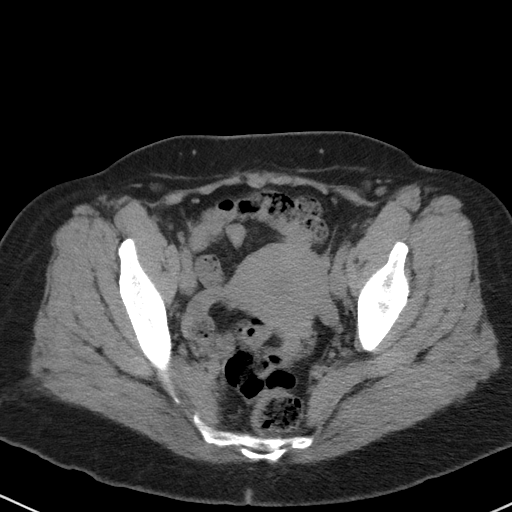
[im 31/85  soft-tissue]
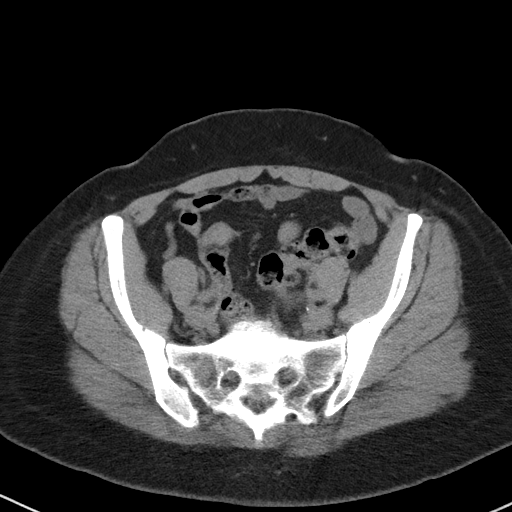
[im 37/85  soft-tissue]
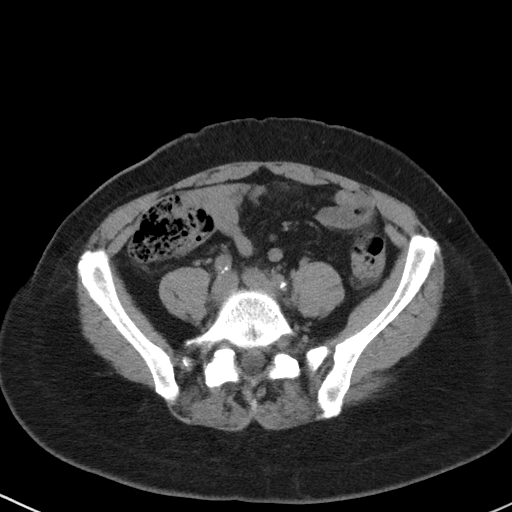
[im 44/85  soft-tissue]
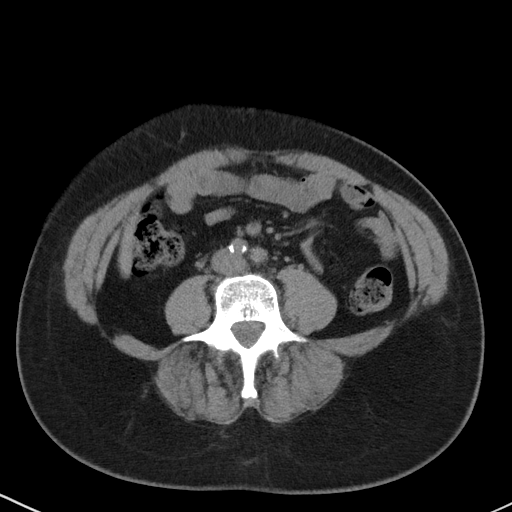
[im 48/85  soft-tissue]
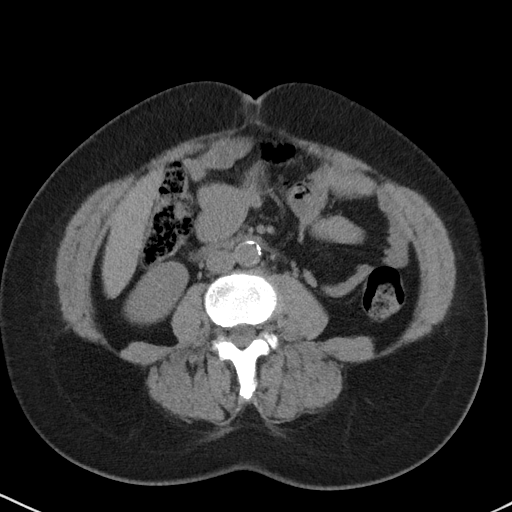
[im 54/85  soft-tissue]
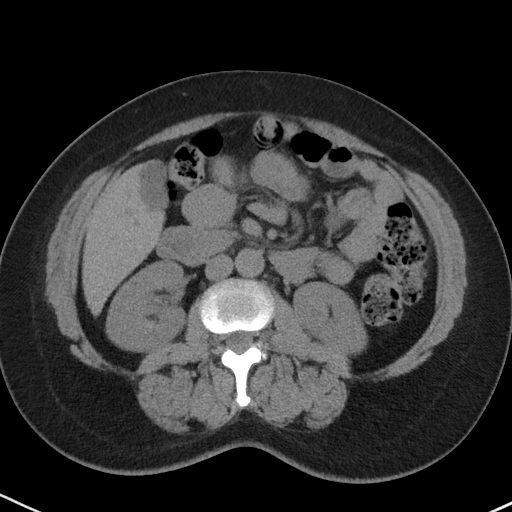
[im 54/85  bone]
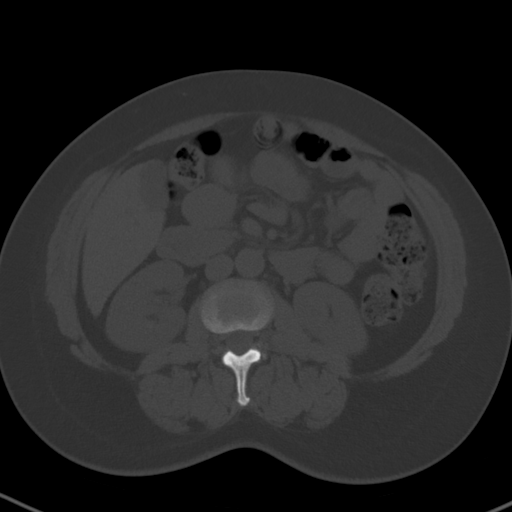
[im 61/85  soft-tissue]
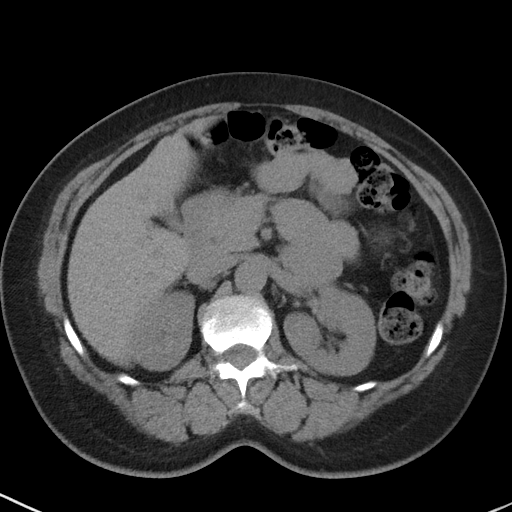
[im 68/85  soft-tissue]
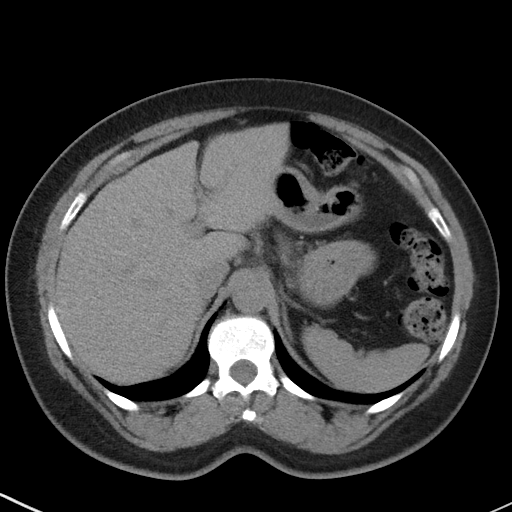
[im 74/85  soft-tissue]
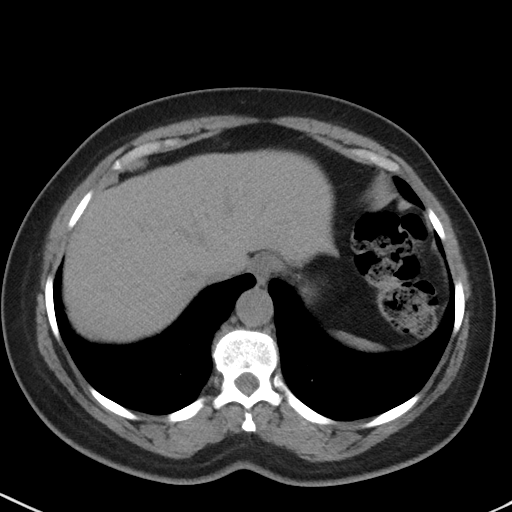
[im 81/85  soft-tissue]
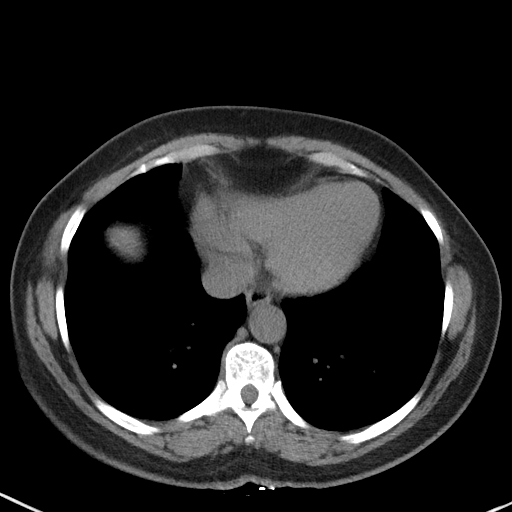

[Series 5: coronal · coronal · 0.69mm/px · 3 of 141 slices shown]
[im 47/141  soft-tissue]
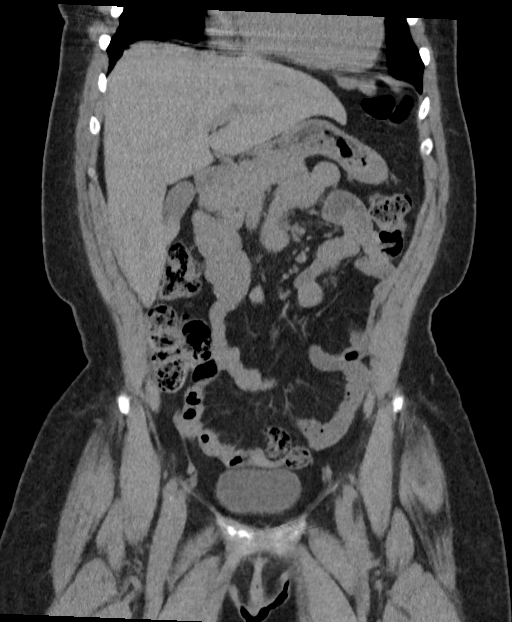
[im 63/141  soft-tissue]
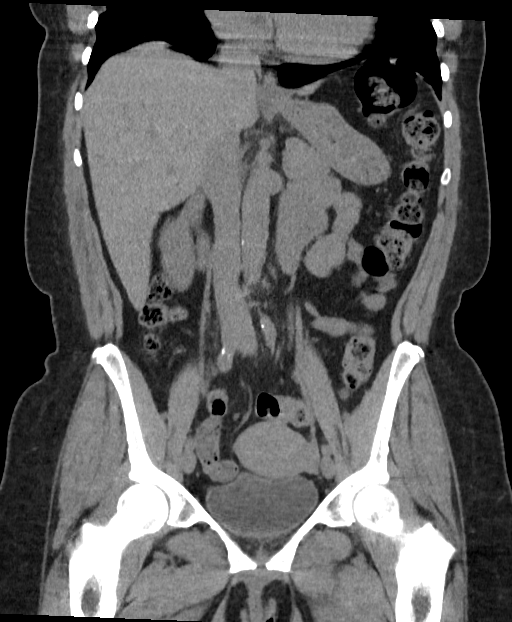
[im 78/141  soft-tissue]
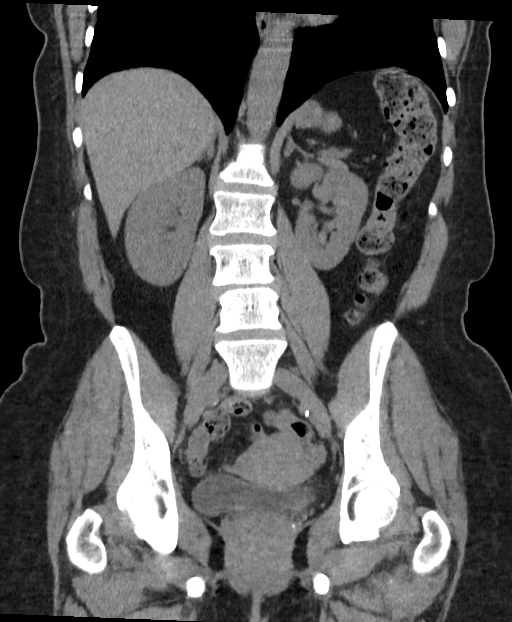

[16 of 46 positions shown; findings below may reference images not displayed]

FINDINGS: Lower chest: No acute pleural or parenchymal lung disease.

Hepatobiliary: No focal liver abnormality is seen. No gallstones,
gallbladder wall thickening, or biliary dilatation.

Pancreas: Unremarkable. No pancreatic ductal dilatation or
surrounding inflammatory changes.

Spleen: Normal in size without focal abnormality.

Adrenals/Urinary Tract: No urinary tract calculi or obstructive
uropathy. The adrenals are normal. Bladder is unremarkable.

Stomach/Bowel: No bowel obstruction or ileus. Normal appendix right
lower quadrant. No bowel wall thickening or inflammatory change.

Vascular/Lymphatic: Aortic atherosclerosis. No enlarged abdominal or
pelvic lymph nodes.

Reproductive: Uterus and bilateral adnexa are unremarkable.

Other: No abdominal wall hernia or abnormality. No abdominopelvic
ascites.

Musculoskeletal: No acute or destructive bony lesions. Reconstructed
images demonstrate no additional findings.
IMPRESSION: 1. No urinary tract calculi or obstructive uropathy.
2. No acute intra-abdominal or intrapelvic process.
3.  Aortic Atherosclerosis (2O061-38W.W).

## 2022-04-14 ENCOUNTER — Ambulatory Visit: Payer: 59 | Admitting: Nurse Practitioner

## 2022-04-28 ENCOUNTER — Encounter: Payer: Self-pay | Admitting: Nurse Practitioner

## 2022-04-28 ENCOUNTER — Ambulatory Visit (INDEPENDENT_AMBULATORY_CARE_PROVIDER_SITE_OTHER): Payer: 59 | Admitting: Nurse Practitioner

## 2022-04-28 VITALS — BP 129/88 | HR 90 | Temp 98.3°F | Resp 16 | Ht 63.5 in | Wt 190.0 lb

## 2022-04-28 DIAGNOSIS — E1165 Type 2 diabetes mellitus with hyperglycemia: Secondary | ICD-10-CM | POA: Diagnosis not present

## 2022-04-28 DIAGNOSIS — M160 Bilateral primary osteoarthritis of hip: Secondary | ICD-10-CM | POA: Diagnosis not present

## 2022-04-28 DIAGNOSIS — Z1231 Encounter for screening mammogram for malignant neoplasm of breast: Secondary | ICD-10-CM

## 2022-04-28 DIAGNOSIS — I10 Essential (primary) hypertension: Secondary | ICD-10-CM | POA: Diagnosis not present

## 2022-04-28 LAB — POCT GLYCOSYLATED HEMOGLOBIN (HGB A1C): Hemoglobin A1C: 6.7 % — AB (ref 4.0–5.6)

## 2022-04-28 NOTE — Progress Notes (Signed)
[facility] [address] [place], [place] [address]  Internal MEDICINE  Office Visit Note  Patient Name: Stacy Curry  [id number]  [id number]  Date of Service: 04/28/2022  Chief Complaint  Patient presents with   Follow-up   Diabetes   Hypertension    HPI Stacy Curry presents for a follow up visit for diabetes and hypertension. Diabetes -- A1c is 6.7 today, up by 0.1 from prior. Working on diet and lifestyle modifications. Prefers not to start any medication yet.  Hypertension -- BP is controlled with current medications Arthritis -- bilateral hips and lower back, the most stiff first things in the morning    Current Medication: Outpatient Encounter Medications as of 04/28/2022  Medication Sig   Accu-Chek Softclix Lancets lancets Use as instructed   amLODipine (NORVASC) 5 MG tablet Take 1 tablet (5 mg total) by mouth daily.   fluconazole (DIFLUCAN) 150 MG tablet Take 1 tablet by mouth once. May repeat after 3 days if symptoms continue.   glucose blood test strip Use as instructed   hydrochlorothiazide (HYDRODIURIL) 12.5 MG tablet Take 1 tablet (12.5 mg total) by mouth daily as needed (lower extremity swelling/fluid retention.). Only take in the morning   losartan-hydrochlorothiazide (HYZAAR) 50-12.5 MG tablet Take 1 tablet by mouth daily.   metroNIDAZOLE (FLAGYL) 500 MG tablet Take 1 tablet (500 mg total) by mouth 2 (two) times daily. For 7 days   Multiple Vitamin (MULTIVITAMIN) tablet Take 1 tablet by mouth daily.   predniSONE (DELTASONE) 10 MG tablet Take one tab 3 x day for 3 days, then take one tab 2 x a day for 3 days and then take one tab a day for 3 days for uri   Vitamin D, Ergocalciferol, (DRISDOL) 1.25 MG (50000 UNIT) CAPS capsule Take 1 capsule (50,000 Units total) by mouth every 7 (seven) days.   Facility-Administered Encounter Medications as of 04/28/2022  Medication   triamcinolone acetonide (KENALOG-40) injection 40 mg    Surgical History: Past  Surgical History:  Procedure Laterality Date   TUBAL LIGATION      Medical History: Past Medical History:  Diagnosis Date   Allergy    Chicken pox age 28   Diabetes mellitus without complication (HCC)    Hypertension     Family History: Family History  Problem Relation Age of Onset   Cancer Mother    Diabetes Father    Heart attack Maternal Grandmother    Breast cancer Neg Hx     Social History   Socioeconomic History   Marital status: Single    Spouse name: Not on file   Number of children: Not on file   Years of education: Not on file   Highest education level: Not on file  Occupational History   Not on file  Tobacco Use   Smoking status: Some Days    Packs/day: 0.50    Types: Cigarettes, E-cigarettes    Last attempt to quit: 05/09/2021    Years since quitting: 0.9   Smokeless tobacco: Never  Vaping Use   Vaping Use: Never used  Substance and Sexual Activity   Alcohol use: Yes    Comment: 1-2 per month (wine or mixed)   Drug use: Never   Sexual activity: Yes    Partners: Male  Other Topics Concern   Not on file  Social History Narrative   Not on file   Social Determinants of Health   Financial Resource Strain: Not on file  Food Insecurity: Not on file  Transportation Needs: Not on file  Physical Activity: Not on file  Stress: Not on file  Social Connections: Not on file  Intimate Partner Violence: Not on file      Review of Systems  Constitutional:  Negative for chills, fatigue and unexpected weight change.  HENT:  Negative for congestion, rhinorrhea, sneezing and sore throat.   Eyes:  Negative for redness.  Respiratory:  Negative for cough, chest tightness and shortness of breath.   Cardiovascular:  Negative for chest pain and palpitations.  Gastrointestinal:  Negative for abdominal pain, constipation, diarrhea, nausea and vomiting.  Genitourinary:  Negative for dysuria and frequency.  Musculoskeletal:  Negative for arthralgias, back pain,  joint swelling and neck pain.  Skin:  Negative for rash.  Neurological: Negative.  Negative for tremors and numbness.  Hematological:  Negative for adenopathy. Does not bruise/bleed easily.  Psychiatric/Behavioral:  Negative for behavioral problems (Depression), sleep disturbance and suicidal ideas. The patient is not nervous/anxious.     Vital Signs: BP 129/88   Pulse 90   Temp 98.3 F (36.8 C)   Resp 16   Ht 5' 3.5" (1.613 m)   Wt 190 lb (86.2 kg)   SpO2 99%   BMI 33.13 kg/m    Physical Exam Vitals reviewed.  Constitutional:      General: She is not in acute distress.    Appearance: Normal appearance. She is obese. She is not ill-appearing.  HENT:     Head: Normocephalic and atraumatic.  Eyes:     Pupils: Pupils are equal, round, and reactive to light.  Cardiovascular:     Rate and Rhythm: Normal rate and regular rhythm.  Pulmonary:     Effort: Pulmonary effort is normal. No respiratory distress.  Neurological:     Mental Status: She is alert and oriented to person, place, and time.  Psychiatric:        Mood and Affect: Mood normal.        Behavior: Behavior normal.        Assessment/Plan: 1. Type 2 diabetes mellitus with hyperglycemia, without long-term current use of insulin (HCC) A1c slightly increased to 6.7, wants to work on diet and exercise and defer medications for now - POCT glycosylated hemoglobin (Hb A1C)  2. Essential hypertension Stable, continue medications as prescribed.   3. Arthritis of both hips Discussed OTC interventions and OTC NSAIDs she can take when having an arthritis flare up as needed.   4. Encounter for screening mammogram for malignant neoplasm of breast Routine mammogram ordered, patient will call to schedule - MM 3D SCREEN BREAST BILATERAL; Future   General Counseling: [name] verbalizes understanding of the findings of todays visit and agrees with plan of treatment. I have discussed any further diagnostic evaluation that may  be needed or ordered today. We also reviewed her medications today. she has been encouraged to call the office with any questions or concerns that should arise related to todays visit.    Orders Placed This Encounter  Procedures   MM 3D SCREEN BREAST BILATERAL   POCT glycosylated hemoglobin (Hb A1C)    No orders of the defined types were placed in this encounter.   Return in about 3 months (around 08/03/2022) for F/U, Recheck A1C, Daleah Coulson PCP.   Total time spent:30 Minutes Time spent includes review of chart, medications, test results, and follow up plan with the patient.    Controlled Substance Database was reviewed by me.  This patient was seen by Jonetta Osgood, FNP-C in collaboration  with Dr. Clayborn Bigness as a part of collaborative care agreement.   Jacaria Colburn R. Valetta Fuller, MSN, FNP-C Internal medicine

## 2022-05-19 ENCOUNTER — Other Ambulatory Visit: Payer: Self-pay

## 2022-05-19 ENCOUNTER — Encounter: Payer: Self-pay | Admitting: Nurse Practitioner

## 2022-05-19 DIAGNOSIS — I10 Essential (primary) hypertension: Secondary | ICD-10-CM

## 2022-05-19 MED ORDER — LOSARTAN POTASSIUM-HCTZ 50-12.5 MG PO TABS: 1.0000 | ORAL_TABLET | Freq: Every day | ORAL | 1 refills | Status: DC

## 2022-05-19 MED ORDER — AMLODIPINE BESYLATE 5 MG PO TABS: 5.0000 mg | ORAL_TABLET | Freq: Every day | ORAL | 1 refills | Status: DC

## 2022-05-23 ENCOUNTER — Encounter: Payer: Self-pay | Admitting: Nurse Practitioner

## 2022-05-24 ENCOUNTER — Encounter: Payer: Self-pay | Admitting: Internal Medicine

## 2022-05-24 ENCOUNTER — Telehealth: Payer: Self-pay

## 2022-05-24 ENCOUNTER — Ambulatory Visit (INDEPENDENT_AMBULATORY_CARE_PROVIDER_SITE_OTHER): Payer: 59 | Admitting: Internal Medicine

## 2022-05-24 VITALS — BP 119/80 | HR 95 | Temp 98.4°F | Resp 16 | Ht 63.5 in | Wt 192.6 lb

## 2022-05-24 DIAGNOSIS — E1165 Type 2 diabetes mellitus with hyperglycemia: Secondary | ICD-10-CM

## 2022-05-24 DIAGNOSIS — K047 Periapical abscess without sinus: Secondary | ICD-10-CM | POA: Diagnosis not present

## 2022-05-24 DIAGNOSIS — K1379 Other lesions of oral mucosa: Secondary | ICD-10-CM | POA: Diagnosis not present

## 2022-05-24 MED ORDER — DOXYCYCLINE HYCLATE 100 MG PO TABS: 100.0000 mg | ORAL_TABLET | Freq: Two times a day (BID) | ORAL | 1 refills | Status: DC

## 2022-05-24 MED ORDER — RYBELSUS 3 MG PO TABS: ORAL_TABLET | ORAL | 3 refills | Status: DC

## 2022-05-24 MED ORDER — TRAMADOL HCL 50 MG PO TABS: 50.0000 mg | ORAL_TABLET | Freq: Three times a day (TID) | ORAL | 0 refills | Status: AC | PRN

## 2022-05-24 NOTE — Telephone Encounter (Signed)
Sent P.A. for patient's Rybelsus. 

## 2022-05-24 NOTE — Progress Notes (Signed)
[facility] [address] [place], [place] [address]  Internal MEDICINE  Office Visit Note  Patient Name: Stacy Curry  [id number]  [id number]  Date of Service: 05/24/2022  Chief Complaint  Patient presents with   Abscess    Under lip and spreading to left side of face. Woke up yesterday with these symptoms,    HPI  Patient is here for acute visit She woke up yesterday with pain and swelling of her upper lip she has a problem with her teeth and has appointment with dentist next month She thinks it is an abscess since her her teeth has been hurting especially the upper lip area, she denies any allergies or any itching Patient has slightly elevated hemoglobin A1c was on Rybelsus before however insurance has increased the amount of coverage and she is unable to afford it   Current Medication: Outpatient Encounter Medications as of 05/24/2022  Medication Sig   Accu-Chek Softclix Lancets lancets Use as instructed   amLODipine (NORVASC) 5 MG tablet Take 1 tablet (5 mg total) by mouth daily.   doxycycline (VIBRA-TABS) 100 MG tablet Take 1 tablet (100 mg total) by mouth 2 (two) times daily.   fluconazole (DIFLUCAN) 150 MG tablet Take 1 tablet by mouth once. May repeat after 3 days if symptoms continue.   glucose blood test strip Use as instructed   hydrochlorothiazide (HYDRODIURIL) 12.5 MG tablet Take 1 tablet (12.5 mg total) by mouth daily as needed (lower extremity swelling/fluid retention.). Only take in the morning   losartan-hydrochlorothiazide (HYZAAR) 50-12.5 MG tablet Take 1 tablet by mouth daily.   metroNIDAZOLE (FLAGYL) 500 MG tablet Take 1 tablet (500 mg total) by mouth 2 (two) times daily. For 7 days   Multiple Vitamin (MULTIVITAMIN) tablet Take 1 tablet by mouth daily.   predniSONE (DELTASONE) 10 MG tablet Take one tab 3 x day for 3 days, then take one tab 2 x a day for 3 days and then take one tab a day for 3 days for uri   Semaglutide (RYBELSUS) 3 MG TABS Take one  tab po qd in am on empty stomach   traMADol (ULTRAM) 50 MG tablet Take 1 tablet (50 mg total) by mouth every 8 (eight) hours as needed for up to 5 days.   Vitamin D, Ergocalciferol, (DRISDOL) 1.25 MG (50000 UNIT) CAPS capsule Take 1 capsule (50,000 Units total) by mouth every 7 (seven) days.   Facility-Administered Encounter Medications as of 05/24/2022  Medication   triamcinolone acetonide (KENALOG-40) injection 40 mg    Surgical History: Past Surgical History:  Procedure Laterality Date   TUBAL LIGATION      Medical History: Past Medical History:  Diagnosis Date   Allergy    Chicken pox age 69   Diabetes mellitus without complication (HCC)    Hypertension     Family History: Family History  Problem Relation Age of Onset   Cancer Mother    Diabetes Father    Heart attack Maternal Grandmother    Breast cancer Neg Hx     Social History   Socioeconomic History   Marital status: Single    Spouse name: Not on file   Number of children: Not on file   Years of education: Not on file   Highest education level: Not on file  Occupational History   Not on file  Tobacco Use   Smoking status: Some Days    Packs/day: 0.50    Types: Cigarettes, E-cigarettes    Last attempt to  quit: 05/09/2021    Years since quitting: 1.0   Smokeless tobacco: Never  Vaping Use   Vaping Use: Never used  Substance and Sexual Activity   Alcohol use: Yes    Comment: 1-2 per month (wine or mixed)   Drug use: Never   Sexual activity: Yes    Partners: Male  Other Topics Concern   Not on file  Social History Narrative   Not on file   Social Determinants of Health   Financial Resource Strain: Not on file  Food Insecurity: Not on file  Transportation Needs: Not on file  Physical Activity: Not on file  Stress: Not on file  Social Connections: Not on file  Intimate Partner Violence: Not on file      Review of Systems  Constitutional:  Negative for fatigue and fever.  HENT:  Positive  for dental problem and facial swelling. Negative for congestion, mouth sores and postnasal drip.   Respiratory:  Negative for cough.   Cardiovascular:  Negative for chest pain.  Genitourinary:  Negative for flank pain.  Psychiatric/Behavioral: Negative.      Vital Signs: BP 119/80   Pulse 95   Temp 98.4 F (36.9 C)   Resp 16   Ht 5' 3.5" (1.613 m)   Wt 192 lb 9.6 oz (87.4 kg)   SpO2 98%   BMI 33.58 kg/m    Physical Exam Constitutional:      Appearance: Normal appearance.  HENT:     Head: Normocephalic and atraumatic.     Nose: Nose normal.     Mouth/Throat:     Mouth: Mucous membranes are moist.     Pharynx: No posterior oropharyngeal erythema.     Comments: Upper lip is swollen there is tenderness, peridental soft tissue swelling as well Eyes:     Extraocular Movements: Extraocular movements intact.     Pupils: Pupils are equal, round, and reactive to light.  Cardiovascular:     Pulses: Normal pulses.     Heart sounds: Normal heart sounds.  Pulmonary:     Effort: Pulmonary effort is normal.     Breath sounds: Normal breath sounds.  Neurological:     General: No focal deficit present.     Mental Status: She is alert.  Psychiatric:        Mood and Affect: Mood normal.        Behavior: Behavior normal.        Assessment/Plan: 1. Abscessed tooth Will go ahead and treat abscess patient is allergic to penicillin so we will start on doxycycline - doxycycline (VIBRA-TABS) 100 MG tablet; Take 1 tablet (100 mg total) by mouth 2 (two) times daily.  Dispense: 20 tablet; Refill: 1  2. Pain in mouth Pain control with tramadol - traMADol (ULTRAM) 50 MG tablet; Take 1 tablet (50 mg total) by mouth every 8 (eight) hours as needed for up to 5 days.  Dispense: 15 tablet; Refill: 0  3. Type 2 diabetes mellitus with hyperglycemia, without long-term current use of insulin (HCC) Patient instructed that it is important that she control her blood sugar will restart her Rybelsus  if this is not covered then we will find or prescribe medication that is covered like SGLT 2, patient was also unable to tolerate metformin due to stomach pain - Semaglutide (RYBELSUS) 3 MG TABS; Take one tab po qd in am on empty stomach  Dispense: 30 tablet; Refill: 3   General Counseling: Latrese verbalizes understanding of the findings of todays visit  and agrees with plan of treatment. I have discussed any further diagnostic evaluation that may be needed or ordered today. We also reviewed her medications today. she has been encouraged to call the office with any questions or concerns that should arise related to todays visit.      Meds ordered this encounter  Medications   Semaglutide (RYBELSUS) 3 MG TABS    Sig: Take one tab po qd in am on empty stomach    Dispense:  30 tablet    Refill:  3   doxycycline (VIBRA-TABS) 100 MG tablet    Sig: Take 1 tablet (100 mg total) by mouth 2 (two) times daily.    Dispense:  20 tablet    Refill:  1   traMADol (ULTRAM) 50 MG tablet    Sig: Take 1 tablet (50 mg total) by mouth every 8 (eight) hours as needed for up to 5 days.    Dispense:  15 tablet    Refill:  0    Total time spent:30 Minutes Time spent includes review of chart, medications, test results, and follow up plan with the patient.   [place] Controlled Substance Database was reviewed by me.   Dr Lyndon Code Internal medicine

## 2022-06-20 IMAGING — CT CT HEAD W/O CM
3 of 4 series · 14 of 47 positions shown, 16 images · non-contrast
Comparison: No pertinent prior exams are available for comparison.

CLINICAL DATA: Intractable episodic tension-type headache.
Headache, tension type. Additional history provided: Vision changes,
dizziness, lightheadedness.

EXAM:
CT HEAD WITHOUT CONTRAST
TECHNIQUE: Contiguous axial images were obtained from the base of the skull
through the vertex without intravenous contrast.

[Series 2: axial st head 5.00 ax · axial · 0.34mm/px · z∈[-565,-445]mm · 8 of 30 slices shown, 10 images]
[im 3/30  brain]
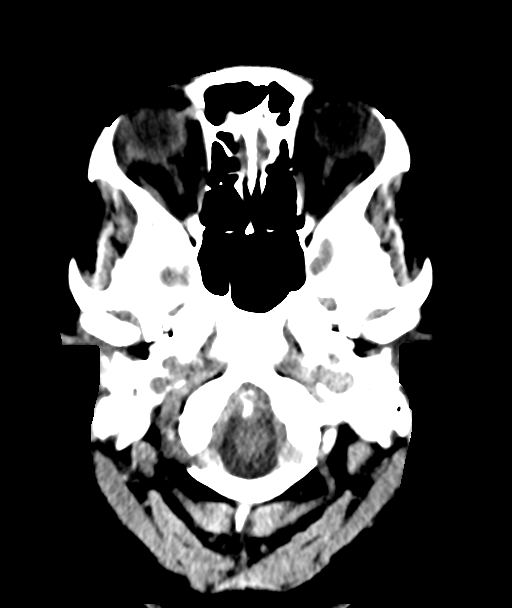
[im 3/30  bone]
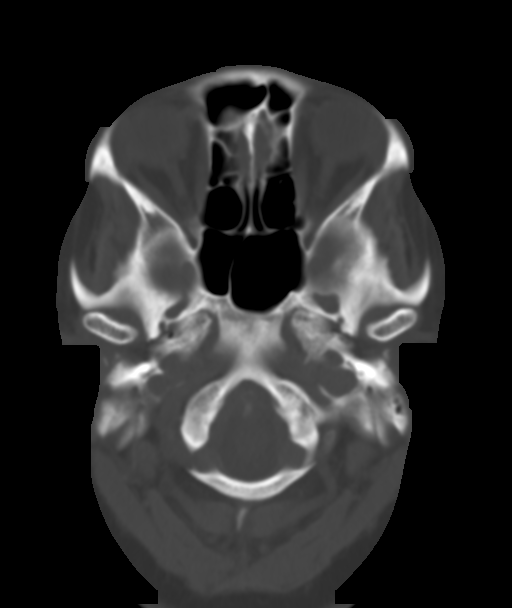
[im 7/30  brain]
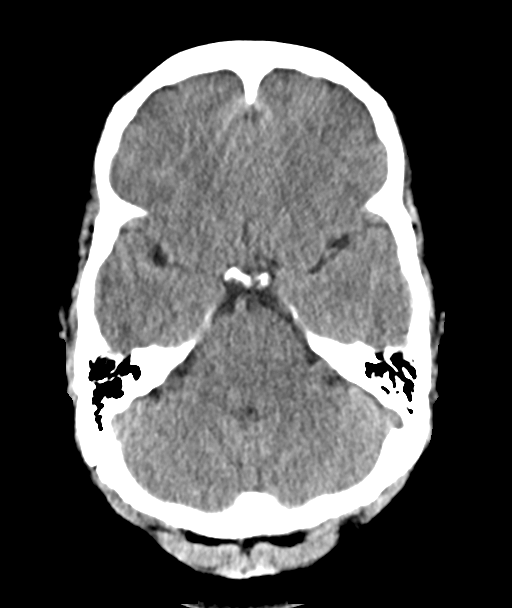
[im 11/30  brain]
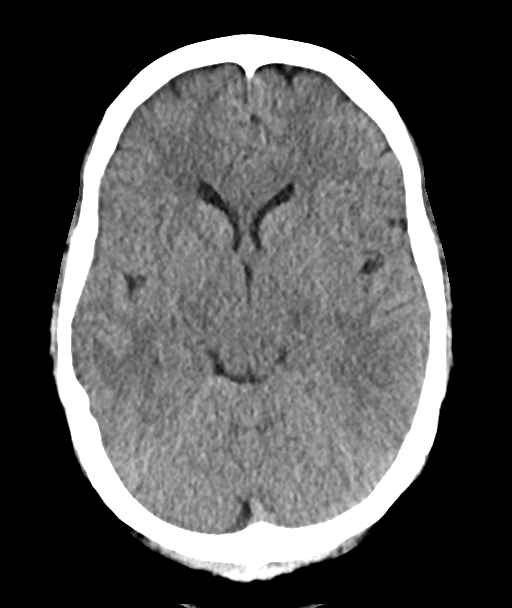
[im 13/30  brain]
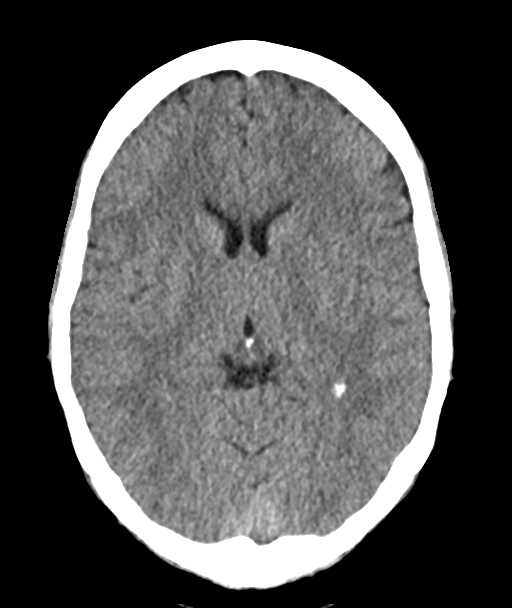
[im 17/30  brain]
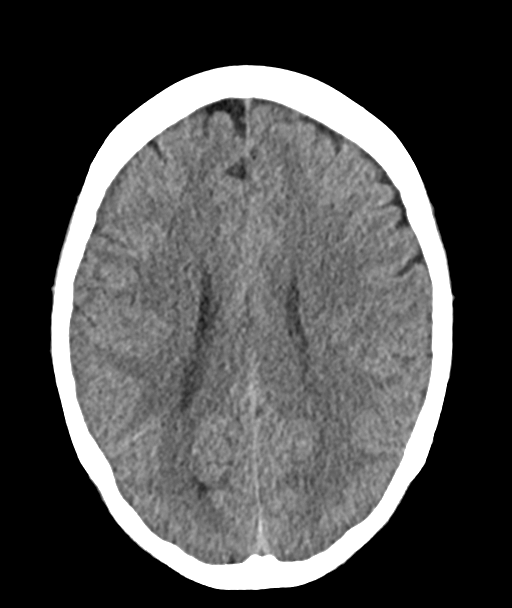
[im 17/30  bone]
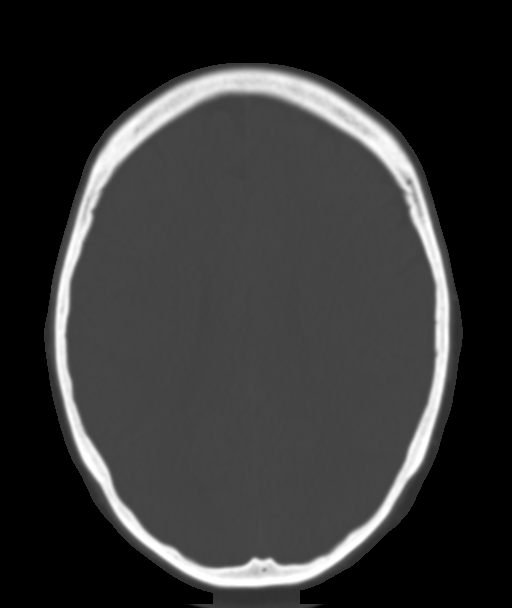
[im 19/30  brain]
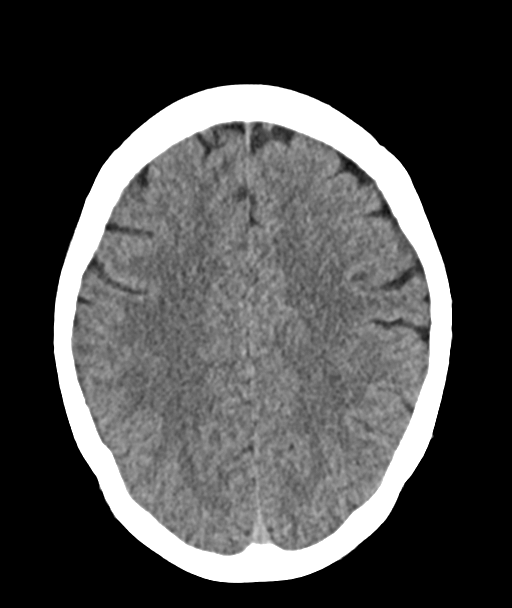
[im 23/30  brain]
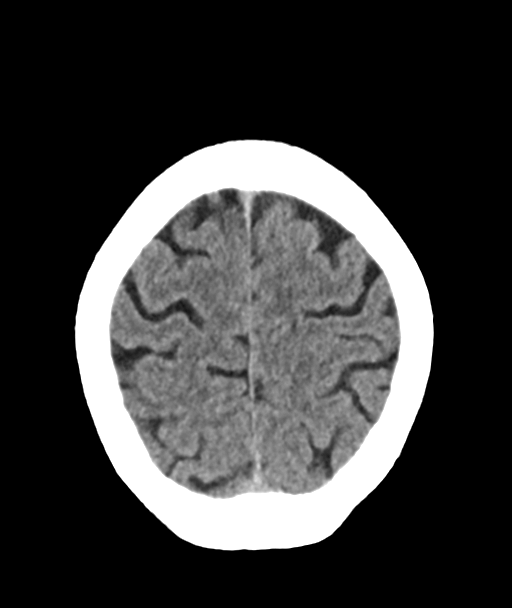
[im 27/30  brain]
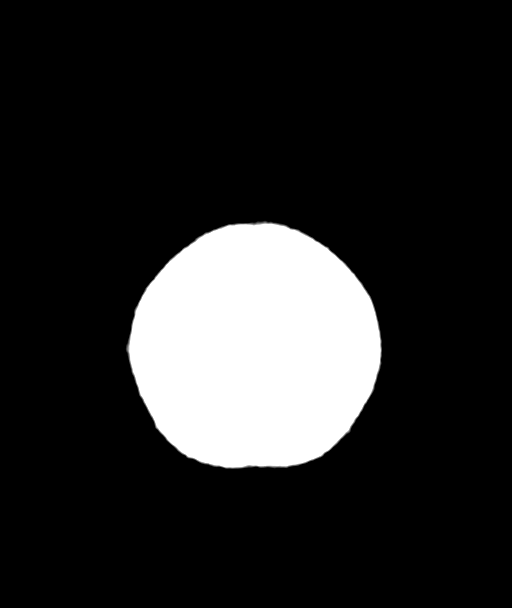

[Series 6: coronals head 3.00 cor · coronal · 0.30mm/px · 3 of 68 slices shown]
[im 23/68  brain]
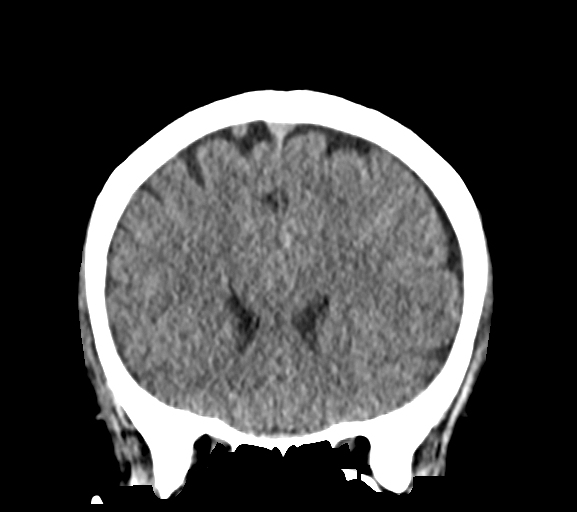
[im 30/68  brain]
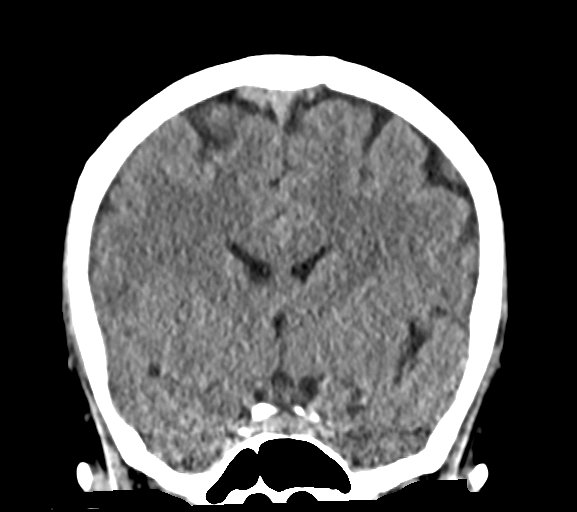
[im 38/68  brain]
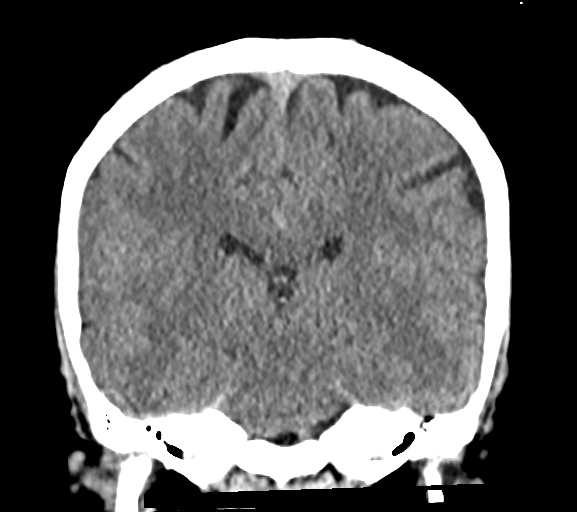

[Series 8: sagittals head 3.00 sag · sagittal · 0.30mm/px · 3 of 57 slices shown]
[im 19/57  brain]
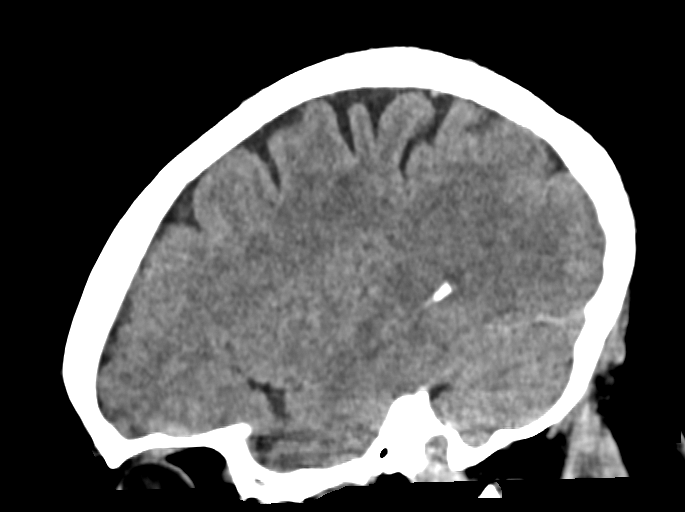
[im 29/57  brain]
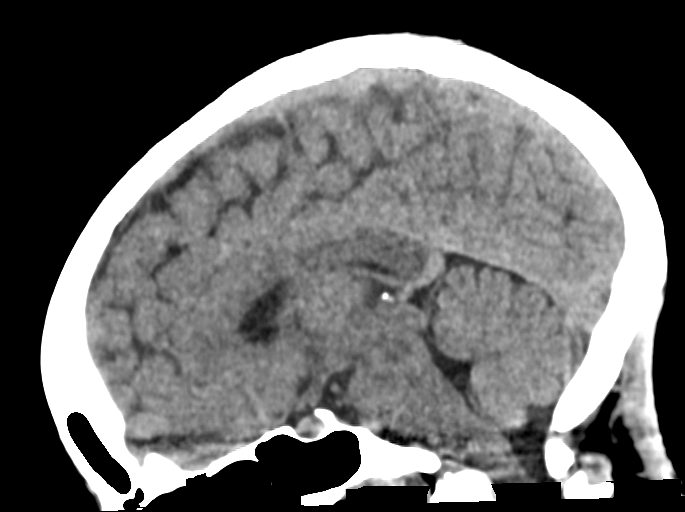
[im 38/57  brain]
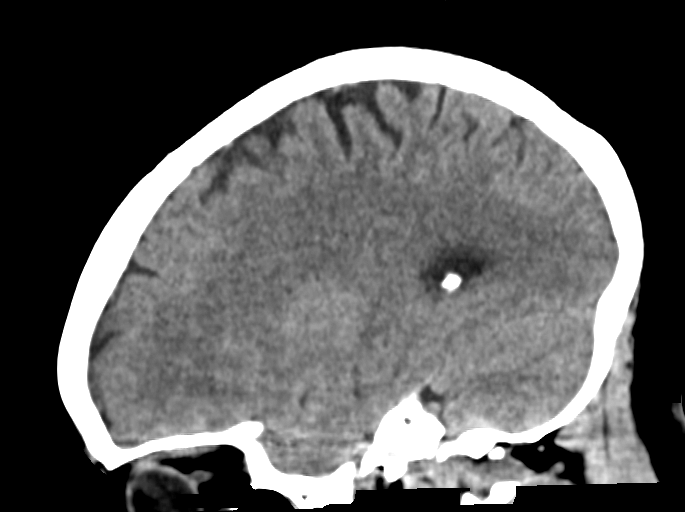

[14 of 47 positions shown; findings below may reference images not displayed]

FINDINGS: Brain:

Cerebral volume is normal for age.

There is no acute intracranial hemorrhage.

No demarcated cortical infarct.

No extra-axial fluid collection.

No evidence of intracranial mass.

No midline shift.

Vascular: No hyperdense vessel.

Skull: Normal. Negative for fracture or focal lesion.

Sinuses/Orbits: Visualized orbits show no acute finding. No
significant paranasal sinus disease or mastoid effusion at the
imaged levels.
IMPRESSION: Unremarkable non-contrast CT appearance of the brain for age. No
evidence of acute intracranial abnormality.

## 2022-06-28 ENCOUNTER — Telehealth: Payer: Self-pay

## 2022-06-28 NOTE — Telephone Encounter (Signed)
PA for Rybelsus was denied.

## 2022-06-28 NOTE — Telephone Encounter (Signed)
PA was done for Rybelsus.

## 2022-07-22 NOTE — Telephone Encounter (Signed)
Done

## 2022-07-28 ENCOUNTER — Ambulatory Visit (INDEPENDENT_AMBULATORY_CARE_PROVIDER_SITE_OTHER): Payer: 59 | Admitting: Nurse Practitioner

## 2022-07-28 ENCOUNTER — Telehealth: Payer: Self-pay | Admitting: Nurse Practitioner

## 2022-07-28 ENCOUNTER — Encounter: Payer: Self-pay | Admitting: Nurse Practitioner

## 2022-07-28 VITALS — BP 129/79 | HR 82 | Temp 98.2°F | Resp 16 | Ht 63.5 in | Wt 192.2 lb

## 2022-07-28 DIAGNOSIS — E1165 Type 2 diabetes mellitus with hyperglycemia: Secondary | ICD-10-CM

## 2022-07-28 DIAGNOSIS — I10 Essential (primary) hypertension: Secondary | ICD-10-CM

## 2022-07-28 DIAGNOSIS — M160 Bilateral primary osteoarthritis of hip: Secondary | ICD-10-CM

## 2022-07-28 DIAGNOSIS — M47817 Spondylosis without myelopathy or radiculopathy, lumbosacral region: Secondary | ICD-10-CM | POA: Diagnosis not present

## 2022-07-28 LAB — POCT GLYCOSYLATED HEMOGLOBIN (HGB A1C): Hemoglobin A1C: 6.6 % — AB (ref 4.0–5.6)

## 2022-07-28 MED ORDER — METHOCARBAMOL 500 MG PO TABS: 500.0000 mg | ORAL_TABLET | Freq: Three times a day (TID) | ORAL | 1 refills | Status: DC | PRN

## 2022-07-28 NOTE — Progress Notes (Signed)
[facility] [place], [place] [address]  Internal MEDICINE  Office Visit Note  Patient Name: Stacy Curry  [id number]  [id number]  Date of Service: 07/28/2022  Chief Complaint  Patient presents with   Diabetes   Hypertension    HPI Marly presents for a follow-up visit for back and hip pain and diabetes A1c is 6.6, slightly improved from December. Working on diet and exercise. Insurance did not approve rybelsus. Right hip and low back pain -- not doing strenuous work but still bothering her.  No weight loiss or gain since last visit.   Current Medication: Outpatient Encounter Medications as of 07/28/2022  Medication Sig   Accu-Chek Softclix Lancets lancets Use as instructed   amLODipine (NORVASC) 5 MG tablet Take 1 tablet (5 mg total) by mouth daily.   fluconazole (DIFLUCAN) 150 MG tablet Take 1 tablet by mouth once. May repeat after 3 days if symptoms continue.   glucose blood test strip Use as instructed   hydrochlorothiazide (HYDRODIURIL) 12.5 MG tablet Take 1 tablet (12.5 mg total) by mouth daily as needed (lower extremity swelling/fluid retention.). Only take in the morning   losartan-hydrochlorothiazide (HYZAAR) 50-12.5 MG tablet Take 1 tablet by mouth daily.   methocarbamol (ROBAXIN) 500 MG tablet Take 1 tablet (500 mg total) by mouth every 8 (eight) hours as needed for muscle spasms.   metroNIDAZOLE (FLAGYL) 500 MG tablet Take 1 tablet (500 mg total) by mouth 2 (two) times daily. For 7 days   Multiple Vitamin (MULTIVITAMIN) tablet Take 1 tablet by mouth daily.   predniSONE (DELTASONE) 10 MG tablet Take one tab 3 x day for 3 days, then take one tab 2 x a day for 3 days and then take one tab a day for 3 days for uri   Vitamin D, Ergocalciferol, (DRISDOL) 1.25 MG (50000 UNIT) CAPS capsule Take 1 capsule (50,000 Units total) by mouth every 7 (seven) days.   [DISCONTINUED] doxycycline (VIBRA-TABS) 100 MG tablet Take 1 tablet (100 mg total) by mouth 2  (two) times daily.   [DISCONTINUED] Semaglutide (RYBELSUS) 3 MG TABS Take one tab po qd in am on empty stomach   Facility-Administered Encounter Medications as of 07/28/2022  Medication   triamcinolone acetonide (KENALOG-40) injection 40 mg    Surgical History: Past Surgical History:  Procedure Laterality Date   TUBAL LIGATION      Medical History: Past Medical History:  Diagnosis Date   Allergy    Chicken pox age 77   Diabetes mellitus without complication ([place])    Hypertension     Family History: Family History  Problem Relation Age of Onset   Cancer Mother    Diabetes Father    Heart attack Maternal Grandmother    Breast cancer Neg Hx     Social History   Socioeconomic History   Marital status: Single    Spouse name: Not on file   Number of children: Not on file   Years of education: Not on file   Highest education level: Not on file  Occupational History   Not on file  Tobacco Use   Smoking status: Some Days    Packs/day: .5    Types: Cigarettes, E-cigarettes    Last attempt to quit: 05/09/2021    Years since quitting: 1.2   Smokeless tobacco: Never  Vaping Use   Vaping Use: Never used  Substance and Sexual Activity   Alcohol use: Yes    Comment: 1-2 per month (wine or mixed)  Drug use: Never   Sexual activity: Yes    Partners: Male  Other Topics Concern   Not on file  Social History Narrative   Not on file   Social Determinants of Health   Financial Resource Strain: Not on file  Food Insecurity: Not on file  Transportation Needs: Not on file  Physical Activity: Not on file  Stress: Not on file  Social Connections: Not on file  Intimate Partner Violence: Not on file      Review of Systems  Constitutional:  Negative for chills, fatigue and unexpected weight change.  HENT:  Negative for congestion, rhinorrhea, sneezing and sore throat.   Eyes:  Negative for redness.  Respiratory:  Negative for cough, chest tightness and shortness of  breath.   Cardiovascular:  Negative for chest pain and palpitations.  Gastrointestinal:  Negative for abdominal pain, constipation, diarrhea, nausea and vomiting.  Genitourinary:  Negative for dysuria and frequency.  Musculoskeletal:  Positive for arthralgias and back pain. Negative for joint swelling and neck pain.  Skin:  Negative for rash.  Neurological: Negative.  Negative for tremors and numbness.  Hematological:  Negative for adenopathy. Does not bruise/bleed easily.  Psychiatric/Behavioral:  Negative for behavioral problems (Depression), sleep disturbance and suicidal ideas. The patient is not nervous/anxious.     Vital Signs: BP 129/79   Pulse 82   Temp 98.2 F (36.8 C)   Resp 16   Ht 5' 3.5" (1.613 m)   Wt 192 lb 3.2 oz (87.2 kg)   SpO2 96%   BMI 33.51 kg/m    Physical Exam Vitals reviewed.  Constitutional:      General: She is not in acute distress.    Appearance: Normal appearance. She is obese. She is not ill-appearing.  HENT:     Head: Normocephalic and atraumatic.  Eyes:     Pupils: Pupils are equal, round, and reactive to light.  Cardiovascular:     Rate and Rhythm: Normal rate and regular rhythm.  Pulmonary:     Effort: Pulmonary effort is normal. No respiratory distress.  Neurological:     Mental Status: She is alert and oriented to person, place, and time.  Psychiatric:        Mood and Affect: Mood normal.        Behavior: Behavior normal.        Assessment/Plan: 1. Type 2 diabetes mellitus with hyperglycemia, without long-term current use of insulin (HCC) A1c slightly improved, continue diet and lifestyle modifications as discussed - POCT glycosylated hemoglobin (Hb A1C)  2. Essential hypertension Stable, continue medications as prescribed..  3. Arthritis of both hips Take methocarbamol for musculoskeletal pain and spasms - methocarbamol (ROBAXIN) 500 MG tablet; Take 1 tablet (500 mg total) by mouth every 8 (eight) hours as needed for  muscle spasms.  Dispense: 90 tablet; Refill: 1  4. Spondylosis of lumbosacral region without myelopathy or radiculopathy Take methocarbamol for musculoskeletal pain and spasms - methocarbamol (ROBAXIN) 500 MG tablet; Take 1 tablet (500 mg total) by mouth every 8 (eight) hours as needed for muscle spasms.  Dispense: 90 tablet; Refill: 1   General Counseling: Tokiko verbalizes understanding of the findings of todays visit and agrees with plan of treatment. I have discussed any further diagnostic evaluation that may be needed or ordered today. We also reviewed her medications today. she has been encouraged to call the office with any questions or concerns that should arise related to todays visit.    Orders Placed This Encounter  Procedures   POCT glycosylated hemoglobin (Hb A1C)    Meds ordered this encounter  Medications   methocarbamol (ROBAXIN) 500 MG tablet    Sig: Take 1 tablet (500 mg total) by mouth every 8 (eight) hours as needed for muscle spasms.    Dispense:  90 tablet    Refill:  1    Return in about 6 months (around 01/28/2023) for F/U, Recheck A1C, Breck [place] PCP.   Total time spent:30 Minutes Time spent includes review of chart, medications, test results, and follow up plan with the patient.   [place] Controlled Substance Database was reviewed by me.  This patient was seen by Jonetta Osgood, FNP-C in collaboration with Dr. Clayborn Bigness as a part of collaborative care agreement.   Bradey Luzier R. Valetta Fuller, MSN, FNP-C Internal medicine

## 2022-07-29 NOTE — Telephone Encounter (Signed)
Error

## 2022-08-15 ENCOUNTER — Encounter: Payer: Self-pay | Admitting: Nurse Practitioner

## 2022-08-15 DIAGNOSIS — I10 Essential (primary) hypertension: Secondary | ICD-10-CM

## 2022-08-15 DIAGNOSIS — E1165 Type 2 diabetes mellitus with hyperglycemia: Secondary | ICD-10-CM

## 2022-08-15 DIAGNOSIS — M47817 Spondylosis without myelopathy or radiculopathy, lumbosacral region: Secondary | ICD-10-CM

## 2022-08-15 DIAGNOSIS — E559 Vitamin D deficiency, unspecified: Secondary | ICD-10-CM

## 2022-08-15 DIAGNOSIS — E782 Mixed hyperlipidemia: Secondary | ICD-10-CM

## 2022-08-15 DIAGNOSIS — E538 Deficiency of other specified B group vitamins: Secondary | ICD-10-CM

## 2022-08-18 ENCOUNTER — Other Ambulatory Visit
Admission: RE | Admit: 2022-08-18 | Discharge: 2022-08-18 | Disposition: A | Discharge status: Home / Self Care | Payer: 59 | Attending: Nurse Practitioner | Admitting: Nurse Practitioner

## 2022-08-18 DIAGNOSIS — E782 Mixed hyperlipidemia: Secondary | ICD-10-CM | POA: Diagnosis not present

## 2022-08-18 DIAGNOSIS — E559 Vitamin D deficiency, unspecified: Secondary | ICD-10-CM | POA: Insufficient documentation

## 2022-08-18 DIAGNOSIS — E538 Deficiency of other specified B group vitamins: Secondary | ICD-10-CM | POA: Diagnosis not present

## 2022-08-18 DIAGNOSIS — I10 Essential (primary) hypertension: Secondary | ICD-10-CM | POA: Insufficient documentation

## 2022-08-18 DIAGNOSIS — E1165 Type 2 diabetes mellitus with hyperglycemia: Secondary | ICD-10-CM | POA: Insufficient documentation

## 2022-08-18 DIAGNOSIS — M47817 Spondylosis without myelopathy or radiculopathy, lumbosacral region: Secondary | ICD-10-CM | POA: Insufficient documentation

## 2022-08-18 LAB — COMPREHENSIVE METABOLIC PANEL
ALT: 13 U/L (ref 0–44)
AST: 15 U/L (ref 15–41)
Albumin: 4 g/dL (ref 3.5–5.0)
Alkaline Phosphatase: 81 U/L (ref 38–126)
Anion gap: 9 (ref 5–15)
BUN: 10 mg/dL (ref 6–20)
CO2: 24 mmol/L (ref 22–32)
Calcium: 9.3 mg/dL (ref 8.9–10.3)
Chloride: 106 mmol/L (ref 98–111)
Creatinine, Ser: 0.64 mg/dL (ref 0.44–1.00)
GFR, Estimated: >60 mL/min (ref >60)
Glucose, Bld: 106 mg/dL — ABNORMAL HIGH (ref 70–99)
Potassium: 3.3 mmol/L — ABNORMAL LOW (ref 3.5–5.1)
Sodium: 139 mmol/L (ref 135–145)
Total Bilirubin: 0.3 mg/dL (ref 0.3–1.2)
Total Protein: 7.1 g/dL (ref 6.5–8.1)

## 2022-08-18 LAB — LIPID PANEL
Cholesterol: 181 mg/dL (ref 0–200)
HDL: 31 mg/dL — ABNORMAL LOW (ref >40)
LDL Cholesterol: 127 mg/dL — ABNORMAL HIGH (ref 0–99)
Total CHOL/HDL Ratio: 5.8 RATIO
Triglycerides: 113 mg/dL (ref <150)
VLDL: 23 mg/dL (ref 0–40)

## 2022-08-18 LAB — CBC WITH DIFFERENTIAL/PLATELET
Abs Immature Granulocytes: 0.01 10*3/uL (ref 0.00–0.07)
Basophils Absolute: 0 10*3/uL (ref 0.0–0.1)
Basophils Relative: 1 %
Eosinophils Absolute: 0.1 10*3/uL (ref 0.0–0.5)
Eosinophils Relative: 2 %
HCT: 42.9 % (ref 36.0–46.0)
Hemoglobin: 14.2 g/dL (ref 12.0–15.0)
Immature Granulocytes: 0 %
Lymphocytes Relative: 48 %
Lymphs Abs: 3.9 10*3/uL (ref 0.7–4.0)
MCH: 29.9 pg (ref 26.0–34.0)
MCHC: 33.1 g/dL (ref 30.0–36.0)
MCV: 90.3 fL (ref 80.0–100.0)
Monocytes Absolute: 0.6 10*3/uL (ref 0.1–1.0)
Monocytes Relative: 7 %
Neutro Abs: 3.3 10*3/uL (ref 1.7–7.7)
Neutrophils Relative %: 42 %
Platelets: 341 10*3/uL (ref 150–400)
RBC: 4.75 MIL/uL (ref 3.87–5.11)
RDW: 13.6 % (ref 11.5–15.5)
WBC: 7.9 10*3/uL (ref 4.0–10.5)
nRBC: 0 % (ref 0.0–0.2)

## 2022-08-18 LAB — VITAMIN D 25 HYDROXY (VIT D DEFICIENCY, FRACTURES): Vit D, 25-Hydroxy: 21.33 ng/mL — ABNORMAL LOW (ref 30–100)

## 2022-08-18 LAB — IRON AND TIBC
Iron: 34 ug/dL (ref 28–170)
Saturation Ratios: 9 % — ABNORMAL LOW (ref 10.4–31.8)
TIBC: 385 ug/dL (ref 250–450)
UIBC: 351 ug/dL

## 2022-08-18 LAB — TSH: TSH: 1.361 u[IU]/mL (ref 0.350–4.500)

## 2022-08-18 LAB — FOLATE: Folate: 9.7 ng/mL (ref >5.9)

## 2022-08-18 LAB — VITAMIN B12: Vitamin B-12: 287 pg/mL (ref 180–914)

## 2022-08-18 LAB — FERRITIN: Ferritin: 60 ng/mL (ref 11–307)

## 2022-08-18 LAB — T4, FREE: Free T4: 0.91 ng/dL (ref 0.61–1.12)

## 2022-08-23 ENCOUNTER — Ambulatory Visit (INDEPENDENT_AMBULATORY_CARE_PROVIDER_SITE_OTHER): Payer: 59 | Admitting: Nurse Practitioner

## 2022-08-23 ENCOUNTER — Encounter: Payer: Self-pay | Admitting: Nurse Practitioner

## 2022-08-23 VITALS — BP 142/77 | Temp 98.3°F | Resp 16 | Ht 63.5 in | Wt 191.0 lb

## 2022-08-23 DIAGNOSIS — R300 Dysuria: Secondary | ICD-10-CM | POA: Diagnosis not present

## 2022-08-23 DIAGNOSIS — E559 Vitamin D deficiency, unspecified: Secondary | ICD-10-CM | POA: Diagnosis not present

## 2022-08-23 DIAGNOSIS — E876 Hypokalemia: Secondary | ICD-10-CM | POA: Diagnosis not present

## 2022-08-23 DIAGNOSIS — B3731 Acute candidiasis of vulva and vagina: Secondary | ICD-10-CM | POA: Diagnosis not present

## 2022-08-23 DIAGNOSIS — N3001 Acute cystitis with hematuria: Secondary | ICD-10-CM

## 2022-08-23 DIAGNOSIS — E538 Deficiency of other specified B group vitamins: Secondary | ICD-10-CM | POA: Diagnosis not present

## 2022-08-23 LAB — POCT URINALYSIS DIPSTICK
Bilirubin, UA: NEGATIVE
Glucose, UA: NEGATIVE
Nitrite, UA: NEGATIVE
Protein, UA: NEGATIVE
Spec Grav, UA: 1.01 (ref 1.010–1.025)
Urobilinogen, UA: 0.2 E.U./dL
pH, UA: 7 (ref 5.0–8.0)

## 2022-08-23 MED ORDER — FLUCONAZOLE 150 MG PO TABS
150.0000 mg | ORAL_TABLET | Freq: Once | ORAL | 0 refills | Status: AC
Start: 2022-08-23 — End: 2022-08-23

## 2022-08-23 MED ORDER — CYANOCOBALAMIN 1000 MCG/ML IJ SOLN
1000.0000 ug | Freq: Once | INTRAMUSCULAR | Status: AC
Start: 2022-08-23 — End: 2022-08-23
  Administered 2022-08-23: 1000 ug via INTRAMUSCULAR

## 2022-08-23 MED ORDER — VITAMIN D (ERGOCALCIFEROL) 1.25 MG (50000 UNIT) PO CAPS
50000.0000 [IU] | ORAL_CAPSULE | ORAL | 1 refills | Status: DC
Start: 2022-08-23 — End: 2023-02-02

## 2022-08-23 MED ORDER — NITROFURANTOIN MONOHYD MACRO 100 MG PO CAPS
100.0000 mg | ORAL_CAPSULE | Freq: Two times a day (BID) | ORAL | 0 refills | Status: AC
Start: 2022-08-23 — End: 2022-08-30

## 2022-08-23 NOTE — Progress Notes (Signed)
[facility] [address] [place], [place] [address]  Internal MEDICINE  Office Visit Note  Patient Name: Stacy Curry  [id number]  [id number]  Date of Service: 08/23/2022  Chief Complaint  Patient presents with   Acute Visit    uti     HPI Saralynn presents for an acute sick visit for possible UTI and lab results UTI symptoms---Frequency, urgency, vaginal itching, back pain,  Urinalysis shows leukocytes and blood.  --low potassium 3.3 --low vitamin D 21 The rest of the lab results were mostly normal.  Low vitamin B12  Current Medication:  Outpatient Encounter Medications as of 08/23/2022  Medication Sig   Accu-Chek Softclix Lancets lancets Use as instructed   amLODipine (NORVASC) 5 MG tablet Take 1 tablet (5 mg total) by mouth daily.   glucose blood test strip Use as instructed   hydrochlorothiazide (HYDRODIURIL) 12.5 MG tablet Take 1 tablet (12.5 mg total) by mouth daily as needed (lower extremity swelling/fluid retention.). Only take in the morning   losartan-hydrochlorothiazide (HYZAAR) 50-12.5 MG tablet Take 1 tablet by mouth daily.   methocarbamol (ROBAXIN) 500 MG tablet Take 1 tablet (500 mg total) by mouth every 8 (eight) hours as needed for muscle spasms.   metroNIDAZOLE (FLAGYL) 500 MG tablet Take 1 tablet (500 mg total) by mouth 2 (two) times daily. For 7 days   Multiple Vitamin (MULTIVITAMIN) tablet Take 1 tablet by mouth daily.   predniSONE (DELTASONE) 10 MG tablet Take one tab 3 x day for 3 days, then take one tab 2 x a day for 3 days and then take one tab a day for 3 days for uri   Vitamin D, Ergocalciferol, (DRISDOL) 1.25 MG (50000 UNIT) CAPS capsule Take 1 capsule (50,000 Units total) by mouth every 7 (seven) days.   [DISCONTINUED] fluconazole (DIFLUCAN) 150 MG tablet Take 1 tablet by mouth once. May repeat after 3 days if symptoms continue.   Facility-Administered Encounter Medications as of 08/23/2022  Medication   triamcinolone acetonide  (KENALOG-40) injection 40 mg      Medical History: Past Medical History:  Diagnosis Date   Allergy    Chicken pox age 34   Diabetes mellitus without complication    Hypertension      Vital Signs: BP (!) 142/77   Temp 98.3 F (36.8 C)   Resp 16   Ht 5' 3.5" (1.613 m)   Wt 191 lb (86.6 kg)   SpO2 97%   BMI 33.30 kg/m    Review of Systems  Physical Exam Vitals reviewed.  Constitutional:      General: She is not in acute distress.    Appearance: Normal appearance. She is obese. She is not ill-appearing.  HENT:     Head: Normocephalic and atraumatic.  Eyes:     Pupils: Pupils are equal, round, and reactive to light.  Cardiovascular:     Rate and Rhythm: Normal rate and regular rhythm.  Pulmonary:     Effort: Pulmonary effort is normal. No respiratory distress.  Neurological:     Mental Status: She is alert and oriented to person, place, and time.  Psychiatric:        Mood and Affect: Mood normal.        Behavior: Behavior normal.       Assessment/Plan: 1. Acute cystitis with hematuria Urine culture sent and macrobid prescribed for UTI treatment.  - CULTURE, URINE COMPREHENSIVE - nitrofurantoin, macrocrystal-monohydrate, (MACROBID) 100 MG capsule; Take 1 capsule (100 mg total) by mouth 2 (two)  times daily for 7 days. Take with food  Dispense: 14 capsule; Refill: 0  2. Hypokalemia Low potassium, discussed diet changes to help bring up her potassium level.   3. Vitamin D deficiency Weekly prescription vitamin D supplement ordered. Follow up in 6 months  - Vitamin D, Ergocalciferol, (DRISDOL) 1.25 MG (50000 UNIT) CAPS capsule; Take 1 capsule (50,000 Units total) by mouth every 7 (seven) days.  Dispense: 12 capsule; Refill: 1  4. B12 deficiency B12 injection administered in office today - cyanocobalamin (VITAMIN B12) injection 1,000 mcg  5. Vulvovaginal candidiasis Fluconazole ordered - fluconazole (DIFLUCAN) 150 MG tablet; Take 1 tablet (150 mg total) by  mouth once for 1 dose. May take an additional dose after 3 days if still symptomatic.  Dispense: 3 tablet; Refill: 0  6. Dysuria Urinalysis was positive for leukocytes and blood - POCT urinalysis dipstick   General Counseling: Yelina verbalizes understanding of the findings of todays visit and agrees with plan of treatment. I have discussed any further diagnostic evaluation that may be needed or ordered today. We also reviewed her medications today. she has been encouraged to call the office with any questions or concerns that should arise related to todays visit.    Counseling:    Orders Placed This Encounter  Procedures   CULTURE, URINE COMPREHENSIVE   POCT urinalysis dipstick    No orders of the defined types were placed in this encounter.   Return in about 3 months (around 11/22/2022) for F/U, Lamberto Dinapoli PCP B12 and vitamin D and also need monthly nurse visit for B12x58mos.  [place] Controlled Substance Database was reviewed by me for overdose risk score (ORS)  Time spent:30 Minutes Time spent with patient included reviewing progress notes, labs, imaging studies, and discussing plan for follow up.   This patient was seen by Sallyanne Kuster, FNP-C in collaboration with Dr. Beverely Risen as a part of collaborative care agreement.  Laya Letendre R. Tedd Sias, MSN, FNP-C Internal Medicine

## 2022-08-26 ENCOUNTER — Encounter: Payer: Self-pay | Admitting: Nurse Practitioner

## 2022-08-26 LAB — CULTURE, URINE COMPREHENSIVE

## 2022-08-30 ENCOUNTER — Ambulatory Visit: Payer: 59

## 2022-09-06 ENCOUNTER — Ambulatory Visit: Payer: 59

## 2022-09-16 ENCOUNTER — Encounter: Payer: Self-pay | Admitting: Nurse Practitioner

## 2022-09-16 NOTE — Telephone Encounter (Signed)
Spoke with pt and made her appt for B12 injection

## 2022-09-20 ENCOUNTER — Ambulatory Visit: Payer: 59

## 2022-09-27 ENCOUNTER — Telehealth: Payer: Self-pay | Admitting: Nurse Practitioner

## 2022-09-27 NOTE — Telephone Encounter (Signed)
Diabetic eye exam scheduled for 01/10/23 @ 2:30 at [facility]

## 2022-10-03 ENCOUNTER — Encounter: Payer: Self-pay | Admitting: Nurse Practitioner

## 2022-10-03 DIAGNOSIS — E538 Deficiency of other specified B group vitamins: Secondary | ICD-10-CM

## 2022-10-04 ENCOUNTER — Ambulatory Visit: Payer: 59

## 2022-10-06 MED ORDER — CYANOCOBALAMIN 1000 MCG/ML IJ SOLN: 1000.0000 ug | INTRAMUSCULAR | 1 refills | Status: DC

## 2022-10-06 MED ORDER — BD LUER-LOK SYRINGE 21G X 1" 3 ML MISC: 3 refills | Status: DC

## 2022-11-01 ENCOUNTER — Encounter: Payer: Self-pay | Admitting: Nurse Practitioner

## 2022-11-01 ENCOUNTER — Ambulatory Visit: Payer: 59

## 2022-11-01 NOTE — Telephone Encounter (Signed)
Spoke with pt as per alyssa make her appt tomorrow

## 2022-11-02 ENCOUNTER — Ambulatory Visit (INDEPENDENT_AMBULATORY_CARE_PROVIDER_SITE_OTHER): Payer: 59 | Admitting: Nurse Practitioner

## 2022-11-02 ENCOUNTER — Ambulatory Visit
Admission: RE | Admit: 2022-11-02 | Discharge: 2022-11-02 | Disposition: A | Discharge status: Home / Self Care | Payer: 59 | Attending: Nurse Practitioner | Admitting: Nurse Practitioner

## 2022-11-02 ENCOUNTER — Encounter: Payer: Self-pay | Admitting: Nurse Practitioner

## 2022-11-02 ENCOUNTER — Ambulatory Visit
Admission: RE | Admit: 2022-11-02 | Discharge: 2022-11-02 | Disposition: A | Discharge status: Home / Self Care | Payer: 59 | Source: Ambulatory Visit | Attending: Nurse Practitioner | Admitting: Nurse Practitioner

## 2022-11-02 VITALS — BP 135/85 | HR 93 | Temp 98.5°F | Resp 16 | Ht 63.5 in | Wt 189.4 lb

## 2022-11-02 DIAGNOSIS — M5442 Lumbago with sciatica, left side: Secondary | ICD-10-CM

## 2022-11-02 DIAGNOSIS — M5136 Other intervertebral disc degeneration, lumbar region: Secondary | ICD-10-CM | POA: Diagnosis not present

## 2022-11-02 DIAGNOSIS — M5441 Lumbago with sciatica, right side: Secondary | ICD-10-CM | POA: Insufficient documentation

## 2022-11-02 DIAGNOSIS — M47816 Spondylosis without myelopathy or radiculopathy, lumbar region: Secondary | ICD-10-CM | POA: Diagnosis not present

## 2022-11-02 DIAGNOSIS — M545 Low back pain, unspecified: Secondary | ICD-10-CM | POA: Diagnosis not present

## 2022-11-02 MED ORDER — CYCLOBENZAPRINE HCL 10 MG PO TABS
5.0000 mg | ORAL_TABLET | Freq: Every evening | ORAL | 1 refills | Status: AC | PRN
Start: 2022-11-02 — End: ?

## 2022-11-02 MED ORDER — IBUPROFEN 800 MG PO TABS
800.0000 mg | ORAL_TABLET | Freq: Three times a day (TID) | ORAL | 0 refills | Status: DC | PRN
Start: 2022-11-02 — End: 2023-02-01

## 2022-11-02 MED ORDER — METHYLPREDNISOLONE ACETATE 80 MG/ML IJ SUSP
80.0000 mg | Freq: Once | INTRAMUSCULAR | Status: AC
Start: 2022-11-02 — End: 2022-11-02
  Administered 2022-11-02: 40 mg via INTRAMUSCULAR

## 2022-11-02 MED ORDER — PREDNISONE 10 MG (21) PO TBPK
ORAL_TABLET | ORAL | 0 refills | Status: DC
Start: 2022-11-02 — End: 2023-03-29

## 2022-11-02 NOTE — Progress Notes (Signed)
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  Took a methocarbamol on Friday and that helped a little bit --low back pain across lumbosacral around with bilateral sciatica  Need work note for Thursday Friday and monday    Current Medication:  Outpatient Encounter Medications as of 11/02/2022  Medication Sig   Accu-Chek Softclix Lancets lancets Use as instructed   amLODipine (NORVASC) 5 MG tablet Take 1 tablet (5 mg total) by mouth daily.   cyanocobalamin (VITAMIN B12) 1000 MCG/ML injection Inject 1 mL (1,000 mcg total) into the muscle every 30 (thirty) days.   cyclobenzaprine (FLEXERIL) 10 MG tablet Take 0.5-1 tablets (5-10 mg total) by mouth at bedtime as needed (musculoskeletal pain).   glucose blood test strip Use as instructed   hydrochlorothiazide (HYDRODIURIL) 12.5 MG tablet Take 1 tablet (12.5 mg total) by mouth daily as needed (lower extremity swelling/fluid retention.). Only take in the morning   ibuprofen (ADVIL) 800 MG tablet Take 1 tablet (800 mg total) by mouth every 8 (eight) hours as needed for moderate pain.   losartan-hydrochlorothiazide (HYZAAR) 50-12.5 MG tablet Take 1 tablet by mouth daily.   methocarbamol (ROBAXIN) 500 MG tablet Take 1 tablet (500 mg total) by mouth every 8 (eight) hours as needed for muscle spasms.   metroNIDAZOLE (FLAGYL) 500 MG tablet Take 1 tablet (500 mg total) by mouth 2 (two) times daily. For 7 days   Multiple Vitamin (MULTIVITAMIN) tablet Take 1 tablet by mouth daily.    predniSONE (STERAPRED UNI-PAK 21 TAB) 10 MG (21) TBPK tablet Use as directed for 6 days   SYRINGE-NEEDLE, DISP, 3 ML (B-D 3CC LUER-LOK SYR 21GX1") 21G X 1" 3 ML MISC Use 1 syringe and needle to inject B12 every 30 days   Vitamin D, Ergocalciferol, (DRISDOL) 1.25 MG (50000 UNIT) CAPS capsule Take 1 capsule (50,000 Units total) by mouth every 7 (seven) days.   [DISCONTINUED] predniSONE (DELTASONE) 10 MG tablet Take one tab 3 x day for 3 days, then take one tab 2 x a day for 3 days and then take one tab a day for 3 days for uri   Facility-Administered Encounter Medications as of 11/02/2022  Medication   [COMPLETED] methylPREDNISolone acetate (DEPO-MEDROL) injection 80 mg   triamcinolone acetonide (KENALOG-40) injection 40 mg      Medical History: Past Medical History:  Diagnosis Date   Allergy    Chicken pox age 81   Diabetes mellitus without complication (HCC)    Hypertension      Vital Signs: BP 135/85 Comment: 158/90  Pulse 93   Temp 98.5 F (36.9 C)   Resp 16   Ht 5' 3.5" (1.613 m)   Wt 189 lb 6.4 oz (85.9 kg)   LMP 10/17/2017   SpO2 98%   BMI 33.02 kg/m    Review of Systems  Constitutional:  Positive for fatigue.  Respiratory: Negative.  Negative for cough, chest tightness, shortness of breath and wheezing.   Cardiovascular: Negative.  Negative for chest pain and palpitations.  Gastrointestinal: Negative.   Musculoskeletal:  Positive for back pain, gait problem and myalgias.    Physical Exam Vitals reviewed.  Constitutional:      General: She is not in acute distress.    Appearance: Normal appearance. She is obese. She is not ill-appearing.  HENT:     Head: Normocephalic and atraumatic.  Eyes:     Pupils: Pupils are equal, round, and reactive to light.  Cardiovascular:     Rate and Rhythm: Normal rate and regular rhythm.  Pulmonary:     Effort: Pulmonary effort is normal. No respiratory distress.  Musculoskeletal:     Lumbar back: Spasms and tenderness  present. Decreased range of motion.  Neurological:     Mental Status: She is alert and oriented to person, place, and time.  Psychiatric:        Mood and Affect: Mood normal.        Behavior: Behavior normal.       Assessment/Plan: 1. Acute bilateral low back pain with bilateral sciatica Lumbar spine xray ordered  Depo medrol injection administered in office to decrease inflammation and swelling.  Ibuprofen 800 mg prescribed as needed for pain and inflammation and muscle relaxant prescribed for patient to take at night. 6 day prednisone taper prescribed as well.  Work note for Thursday, Friday and Monday.  - DG Lumbar Spine Complete; Future - cyclobenzaprine (FLEXERIL) 10 MG tablet; Take 0.5-1 tablets (5-10 mg total) by mouth at bedtime as needed (musculoskeletal pain).  Dispense: 30 tablet; Refill: 1 - ibuprofen (ADVIL) 800 MG tablet; Take 1 tablet (800 mg total) by mouth every 8 (eight) hours as needed for moderate pain.  Dispense: 90 tablet; Refill: 0 - methylPREDNISolone acetate (DEPO-MEDROL) injection 80 mg - predniSONE (STERAPRED UNI-PAK 21 TAB) 10 MG (21) TBPK tablet; Use as directed for 6 days  Dispense: 21 tablet; Refill: 0    General Counseling: Stacy Curry verbalizes understanding of the findings of todays visit and agrees with plan of treatment. I have discussed any further diagnostic evaluation that may be needed or ordered today. We also reviewed her medications today. she has been encouraged to call the office with any questions or concerns that should arise related to todays visit.    Counseling:    Orders Placed This Encounter  Procedures   DG Lumbar Spine Complete    Meds ordered this encounter  Medications   cyclobenzaprine (FLEXERIL) 10 MG tablet    Sig: Take 0.5-1 tablets (5-10 mg total) by mouth at bedtime as needed (musculoskeletal pain).    Dispense:  30 tablet    Refill:  1   ibuprofen (ADVIL) 800 MG tablet    Sig: Take 1 tablet (800 mg total) by mouth  every 8 (eight) hours as needed for moderate pain.    Dispense:  90 tablet    Refill:  0   methylPREDNISolone acetate (DEPO-MEDROL) injection 80 mg   predniSONE (STERAPRED UNI-PAK 21 TAB) 10 MG (21) TBPK tablet    Sig: Use as directed for 6 days    Dispense:  21 tablet    Refill:  0    Return if symptoms worsen or fail to improve.  [place] Controlled Substance Database was reviewed by me for overdose risk score (ORS)  Time spent:30 Minutes Time spent with patient included reviewing progress notes, labs, imaging studies, and discussing plan for follow up.   This patient was seen by Sallyanne Kuster, FNP-C in collaboration with Dr. Beverely Risen  as a part of collaborative care agreement.  Elvena Oyer R. Tedd Sias, MSN, FNP-C Internal Medicine

## 2022-11-08 ENCOUNTER — Telehealth: Payer: Self-pay | Admitting: Nurse Practitioner

## 2022-11-08 ENCOUNTER — Encounter: Payer: Self-pay | Admitting: Nurse Practitioner

## 2022-11-08 ENCOUNTER — Ambulatory Visit (INDEPENDENT_AMBULATORY_CARE_PROVIDER_SITE_OTHER): Payer: 59 | Admitting: Nurse Practitioner

## 2022-11-08 VITALS — BP 138/80 | HR 100 | Temp 98.6°F | Resp 16 | Ht 63.5 in | Wt 192.2 lb

## 2022-11-08 DIAGNOSIS — M5442 Lumbago with sciatica, left side: Secondary | ICD-10-CM

## 2022-11-08 DIAGNOSIS — M5441 Lumbago with sciatica, right side: Secondary | ICD-10-CM

## 2022-11-08 NOTE — Telephone Encounter (Signed)
Awaiting 11/08/22 office notes for Orthopedic referral-[facility] 

## 2022-11-08 NOTE — Progress Notes (Signed)
There are degenerative changes and arthritis. Arthritis flare ups can be very painful. There are no acute abnormalities or other significant changes. I can refer to orthopedic specialist for further evaluation and treatment if she is interested.

## 2022-11-08 NOTE — Progress Notes (Signed)
[facility] [address] [place], [place] [address]  Internal MEDICINE  Office Visit Note  Patient Name: Cortina Swaziland  [id number]  [id number]  Date of Service: 11/08/2022  Chief Complaint  Patient presents with   Follow-up    Review x-rays results     HPI Manha presents for a follow-up visit for low back pain Xrays reviewed.  Mild degenerative changes noted. Most likely having an arthritis flare up.  Feeling a little better but still hurting. Interested in orthopedic referral.    Current Medication: Outpatient Encounter Medications as of 11/08/2022  Medication Sig   Accu-Chek Softclix Lancets lancets Use as instructed   amLODipine (NORVASC) 5 MG tablet Take 1 tablet (5 mg total) by mouth daily.   cyanocobalamin (VITAMIN B12) 1000 MCG/ML injection Inject 1 mL (1,000 mcg total) into the muscle every 30 (thirty) days.   cyclobenzaprine (FLEXERIL) 10 MG tablet Take 0.5-1 tablets (5-10 mg total) by mouth at bedtime as needed (musculoskeletal pain).   glucose blood test strip Use as instructed   hydrochlorothiazide (HYDRODIURIL) 12.5 MG tablet Take 1 tablet (12.5 mg total) by mouth daily as needed (lower extremity swelling/fluid retention.). Only take in the morning   ibuprofen (ADVIL) 800 MG tablet Take 1 tablet (800 mg total) by mouth every 8 (eight) hours as needed for moderate pain.   losartan-hydrochlorothiazide (HYZAAR) 50-12.5 MG tablet Take 1 tablet by mouth daily.   methocarbamol (ROBAXIN) 500 MG tablet Take 1 tablet (500 mg total) by mouth every 8 (eight) hours as needed for muscle spasms.   metroNIDAZOLE (FLAGYL) 500 MG tablet Take 1 tablet (500 mg total) by mouth 2 (two) times daily. For 7 days   Multiple Vitamin (MULTIVITAMIN) tablet Take 1 tablet by mouth daily.   predniSONE (STERAPRED UNI-PAK 21 TAB) 10 MG (21) TBPK tablet Use as directed for 6 days   SYRINGE-NEEDLE, DISP, 3 ML (B-D 3CC LUER-LOK SYR 21GX1") 21G X 1" 3 ML MISC Use 1 syringe and needle to  inject B12 every 30 days   Vitamin D, Ergocalciferol, (DRISDOL) 1.25 MG (50000 UNIT) CAPS capsule Take 1 capsule (50,000 Units total) by mouth every 7 (seven) days.   Facility-Administered Encounter Medications as of 11/08/2022  Medication   triamcinolone acetonide (KENALOG-40) injection 40 mg    Surgical History: Past Surgical History:  Procedure Laterality Date   TUBAL LIGATION      Medical History: Past Medical History:  Diagnosis Date   Allergy    Chicken pox age 18   Diabetes mellitus without complication (HCC)    Hypertension     Family History: Family History  Problem Relation Age of Onset   Cancer Mother    Diabetes Father    Heart attack Maternal Grandmother    Breast cancer Neg Hx     Social History   Socioeconomic History   Marital status: Single    Spouse name: Not on file   Number of children: Not on file   Years of education: Not on file   Highest education level: Not on file  Occupational History   Not on file  Tobacco Use   Smoking status: Some Days    Packs/day: .5    Types: Cigarettes, E-cigarettes    Last attempt to quit: 05/09/2021    Years since quitting: 1.5   Smokeless tobacco: Never  Vaping Use   Vaping Use: Never used  Substance and Sexual Activity   Alcohol use: Yes    Comment: 1-2 per month (wine or mixed)  Drug use: Never   Sexual activity: Yes    Partners: Male  Other Topics Concern   Not on file  Social History Narrative   Not on file   Social Determinants of Health   Financial Resource Strain: Not on file  Food Insecurity: Not on file  Transportation Needs: Not on file  Physical Activity: Not on file  Stress: Not on file  Social Connections: Not on file  Intimate Partner Violence: Not on file      Review of Systems  Constitutional:  Positive for fatigue.  Respiratory: Negative.  Negative for cough, chest tightness, shortness of breath and wheezing.   Cardiovascular: Negative.  Negative for chest pain and  palpitations.  Gastrointestinal: Negative.   Musculoskeletal:  Positive for back pain, gait problem and myalgias.    Vital Signs: BP 138/80   Pulse 100   Temp 98.6 F (37 C)   Resp 16   Ht 5' 3.5" (1.613 m)   Wt 192 lb 3.2 oz (87.2 kg)   LMP 10/17/2017   SpO2 99%   BMI 33.51 kg/m    Physical Exam Vitals reviewed.  Constitutional:      General: She is not in acute distress.    Appearance: Normal appearance. She is obese. She is not ill-appearing.  HENT:     Head: Normocephalic and atraumatic.  Eyes:     Pupils: Pupils are equal, round, and reactive to light.  Cardiovascular:     Rate and Rhythm: Normal rate and regular rhythm.  Pulmonary:     Effort: Pulmonary effort is normal. No respiratory distress.  Musculoskeletal:     Lumbar back: Spasms and tenderness present. Decreased range of motion.  Neurological:     Mental Status: She is alert and oriented to person, place, and time.  Psychiatric:        Mood and Affect: Mood normal.        Behavior: Behavior normal.        Assessment/Plan: 1. Acute bilateral low back pain with bilateral sciatica Referred to [facility] - Ambulatory referral to Orthopedic Surgery   General Counseling: Billiejean verbalizes understanding of the findings of todays visit and agrees with plan of treatment. I have discussed any further diagnostic evaluation that may be needed or ordered today. We also reviewed her medications today. she has been encouraged to call the office with any questions or concerns that should arise related to todays visit.    Orders Placed This Encounter  Procedures   Ambulatory referral to Orthopedic Surgery    No orders of the defined types were placed in this encounter.   Return if symptoms worsen or fail to improve, for previously scheduled, CPE, Galan Ghee PCP in september. .   Total time spent:30 Minutes Time spent includes review of chart, medications, test results, and follow up plan with the patient.    [place] Controlled Substance Database was reviewed by me.  This patient was seen by Sallyanne Kuster, FNP-C in collaboration with Dr. Beverely Risen as a part of collaborative care agreement.   Shyteria Lewis R. Tedd Sias, MSN, FNP-C Internal medicine

## 2022-11-09 ENCOUNTER — Telehealth: Payer: Self-pay | Admitting: Nurse Practitioner

## 2022-11-09 NOTE — Telephone Encounter (Signed)
Orthopedic referral faxed to [facility]; [phone]

## 2022-11-10 ENCOUNTER — Encounter: Payer: Self-pay | Admitting: Nurse Practitioner

## 2022-11-10 DIAGNOSIS — M546 Pain in thoracic spine: Secondary | ICD-10-CM

## 2022-11-10 DIAGNOSIS — R109 Unspecified abdominal pain: Secondary | ICD-10-CM

## 2022-11-15 ENCOUNTER — Telehealth: Payer: Self-pay | Admitting: Nurse Practitioner

## 2022-11-15 NOTE — Addendum Note (Signed)
Addended by: Sallyanne Kuster on: 11/15/2022 08:07 AM   Modules accepted: Orders

## 2022-11-15 NOTE — Telephone Encounter (Signed)
Notified patient of U/S appointment date, arrival time, location and to arrive with full bladder-[facility] 

## 2022-11-21 ENCOUNTER — Telehealth: Payer: Self-pay | Admitting: Nurse Practitioner

## 2022-11-21 ENCOUNTER — Encounter: Payer: Self-pay | Admitting: Nurse Practitioner

## 2022-11-21 ENCOUNTER — Ambulatory Visit
Admission: RE | Admit: 2022-11-21 | Discharge: 2022-11-21 | Disposition: A | Discharge status: Home / Self Care | Payer: 59 | Source: Ambulatory Visit | Attending: Nurse Practitioner | Admitting: Nurse Practitioner

## 2022-11-21 DIAGNOSIS — R109 Unspecified abdominal pain: Secondary | ICD-10-CM | POA: Insufficient documentation

## 2022-11-21 DIAGNOSIS — M546 Pain in thoracic spine: Secondary | ICD-10-CM | POA: Insufficient documentation

## 2022-11-21 NOTE — Telephone Encounter (Signed)
Received matrix absence management form. Gave to Alyssa for signature

## 2022-11-22 ENCOUNTER — Ambulatory Visit: Payer: 59 | Admitting: Nurse Practitioner

## 2022-11-25 ENCOUNTER — Ambulatory Visit: Payer: 59 | Admitting: Nurse Practitioner

## 2022-11-25 ENCOUNTER — Encounter: Payer: Self-pay | Admitting: Nurse Practitioner

## 2022-11-25 ENCOUNTER — Telehealth: Payer: Self-pay | Admitting: Nurse Practitioner

## 2022-11-25 VITALS — BP 135/72 | HR 86 | Temp 98.3°F | Resp 16 | Ht 63.5 in | Wt 191.6 lb

## 2022-11-25 DIAGNOSIS — M5441 Lumbago with sciatica, right side: Secondary | ICD-10-CM | POA: Diagnosis not present

## 2022-11-25 DIAGNOSIS — D229 Melanocytic nevi, unspecified: Secondary | ICD-10-CM | POA: Diagnosis not present

## 2022-11-25 DIAGNOSIS — M5442 Lumbago with sciatica, left side: Secondary | ICD-10-CM | POA: Diagnosis not present

## 2022-11-25 DIAGNOSIS — M546 Pain in thoracic spine: Secondary | ICD-10-CM | POA: Diagnosis not present

## 2022-11-25 MED ORDER — MELOXICAM 15 MG PO TABS
15.0000 mg | ORAL_TABLET | Freq: Every day | ORAL | 1 refills | Status: DC
Start: 2022-11-25 — End: 2023-03-29

## 2022-11-25 NOTE — Telephone Encounter (Signed)
Awaiting 11/25/22 office notes for Dermatology referral-[facility]

## 2022-11-25 NOTE — Progress Notes (Signed)
[facility] [address] [place], [place] [address]  Internal MEDICINE  Office Visit Note  Patient Name: Stacy Curry  [id number]  [id number]  Date of Service: 11/25/2022  Chief Complaint  Patient presents with   Follow-up    Renal ultrasound results, back pain and mole on back    HPI Stacy Curry presents for a follow-up visit for ultrasound results, back pain and skin mole. Renal ultrasound is normal Back pain and arthritic pain -- muscle relaxer and NSAID Mole on right mid back -- suspicious and needs to be removed.      Current Medication: Outpatient Encounter Medications as of 11/25/2022  Medication Sig   Accu-Chek Softclix Lancets lancets Use as instructed   amLODipine (NORVASC) 5 MG tablet Take 1 tablet (5 mg total) by mouth daily.   cyanocobalamin (VITAMIN B12) 1000 MCG/ML injection Inject 1 mL (1,000 mcg total) into the muscle every 30 (thirty) days.   cyclobenzaprine (FLEXERIL) 10 MG tablet Take 0.5-1 tablets (5-10 mg total) by mouth at bedtime as needed (musculoskeletal pain).   glucose blood test strip Use as instructed   hydrochlorothiazide (HYDRODIURIL) 12.5 MG tablet Take 1 tablet (12.5 mg total) by mouth daily as needed (lower extremity swelling/fluid retention.). Only take in the morning   ibuprofen (ADVIL) 800 MG tablet Take 1 tablet (800 mg total) by mouth every 8 (eight) hours as needed for moderate pain.   losartan-hydrochlorothiazide (HYZAAR) 50-12.5 MG tablet Take 1 tablet by mouth daily.   meloxicam (MOBIC) 15 MG tablet Take 1 tablet (15 mg total) by mouth daily. In am with breakfast   methocarbamol (ROBAXIN) 500 MG tablet Take 1 tablet (500 mg total) by mouth every 8 (eight) hours as needed for muscle spasms.   metroNIDAZOLE (FLAGYL) 500 MG tablet Take 1 tablet (500 mg total) by mouth 2 (two) times daily. For 7 days   Multiple Vitamin (MULTIVITAMIN) tablet Take 1 tablet by mouth daily.   predniSONE (STERAPRED UNI-PAK 21 TAB) 10 MG (21) TBPK  tablet Use as directed for 6 days   SYRINGE-NEEDLE, DISP, 3 ML (B-D 3CC LUER-LOK SYR 21GX1") 21G X 1" 3 ML MISC Use 1 syringe and needle to inject B12 every 30 days   Vitamin D, Ergocalciferol, (DRISDOL) 1.25 MG (50000 UNIT) CAPS capsule Take 1 capsule (50,000 Units total) by mouth every 7 (seven) days.   Facility-Administered Encounter Medications as of 11/25/2022  Medication   triamcinolone acetonide (KENALOG-40) injection 40 mg    Surgical History: Past Surgical History:  Procedure Laterality Date   TUBAL LIGATION      Medical History: Past Medical History:  Diagnosis Date   Allergy    Chicken pox age 19   Diabetes mellitus without complication (HCC)    Hypertension     Family History: Family History  Problem Relation Age of Onset   Cancer Mother    Diabetes Father    Heart attack Maternal Grandmother    Breast cancer Neg Hx     Social History   Socioeconomic History   Marital status: Single    Spouse name: Not on file   Number of children: Not on file   Years of education: Not on file   Highest education level: Not on file  Occupational History   Not on file  Tobacco Use   Smoking status: Some Days    Current packs/day: 0.00    Types: Cigarettes, E-cigarettes    Last attempt to quit: 05/09/2021    Years since quitting: 1.5  Smokeless tobacco: Never  Vaping Use   Vaping status: Never Used  Substance and Sexual Activity   Alcohol use: Yes    Comment: 1-2 per month (wine or mixed)   Drug use: Never   Sexual activity: Yes    Partners: Male  Other Topics Concern   Not on file  Social History Narrative   Not on file   Social Determinants of Health   Financial Resource Strain: Not on file  Food Insecurity: Not on file  Transportation Needs: Not on file  Physical Activity: Not on file  Stress: Not on file  Social Connections: Not on file  Intimate Partner Violence: Not on file      Review of Systems  Constitutional:  Positive for fatigue.   Respiratory: Negative.  Negative for cough, chest tightness, shortness of breath and wheezing.   Cardiovascular: Negative.  Negative for chest pain and palpitations.  Gastrointestinal: Negative.   Musculoskeletal:  Positive for back pain, gait problem and myalgias.    Vital Signs: BP 135/72 Comment: 150/86  Pulse 86   Temp 98.3 F (36.8 C)   Resp 16   Ht 5' 3.5" (1.613 m)   Wt 191 lb 9.6 oz (86.9 kg)   LMP 10/17/2017   SpO2 98%   BMI 33.41 kg/m    Physical Exam Vitals reviewed.  Constitutional:      General: She is not in acute distress.    Appearance: Normal appearance. She is obese. She is not ill-appearing.  HENT:     Head: Normocephalic and atraumatic.  Eyes:     Pupils: Pupils are equal, round, and reactive to light.  Cardiovascular:     Rate and Rhythm: Normal rate and regular rhythm.  Pulmonary:     Effort: Pulmonary effort is normal. No respiratory distress.  Musculoskeletal:     Lumbar back: Spasms and tenderness present. Decreased range of motion.  Skin:    Findings: Lesion (irregular mole) present.       Neurological:     Mental Status: She is alert and oriented to person, place, and time.  Psychiatric:        Mood and Affect: Mood normal.        Behavior: Behavior normal.        Assessment/Plan: 1. Acute bilateral low back pain with bilateral sciatica Try meloxicam for back pain, follow up in September  - meloxicam (MOBIC) 15 MG tablet; Take 1 tablet (15 mg total) by mouth daily. In am with breakfast  Dispense: 30 tablet; Refill: 1  2. Acute midline thoracic back pain Try meloxicam for back pain, follow up in december - meloxicam (MOBIC) 15 MG tablet; Take 1 tablet (15 mg total) by mouth daily. In am with breakfast  Dispense: 30 tablet; Refill: 1  3. Atypical mole Referred to dermatology, will need mole removed.  - Ambulatory referral to Dermatology   General Counseling: Stacy Curry verbalizes understanding of the findings of todays visit and  agrees with plan of treatment. I have discussed any further diagnostic evaluation that may be needed or ordered today. We also reviewed her medications today. she has been encouraged to call the office with any questions or concerns that should arise related to todays visit.    Orders Placed This Encounter  Procedures   Ambulatory referral to Dermatology    Meds ordered this encounter  Medications   meloxicam (MOBIC) 15 MG tablet    Sig: Take 1 tablet (15 mg total) by mouth daily. In am with breakfast  Dispense:  30 tablet    Refill:  1    Return for previously scheduled, CPE, Minami Arriaga PCP in september. .   Total time spent:30 Minutes Time spent includes review of chart, medications, test results, and follow up plan with the patient.   [place] Controlled Substance Database was reviewed by me.  This patient was seen by Sallyanne Kuster, FNP-C in collaboration with Dr. Beverely Risen as a part of collaborative care agreement.   Espen Bethel R. Tedd Sias, MSN, FNP-C Internal medicine

## 2022-11-28 ENCOUNTER — Telehealth: Payer: Self-pay | Admitting: Nurse Practitioner

## 2022-11-28 NOTE — Telephone Encounter (Signed)
Dermatology referral sent via [facility] to [facility]. [facility] not accepting new patients at this time-Toni

## 2022-11-28 NOTE — Telephone Encounter (Signed)
Dermatology appointment 12/06/2022 @ [facility]-Toni

## 2022-11-28 NOTE — Telephone Encounter (Signed)
[employer] form completed. Faxed back; [phone]. Scanned-Toni

## 2022-11-29 ENCOUNTER — Ambulatory Visit: Payer: 59

## 2022-12-02 ENCOUNTER — Encounter: Payer: Self-pay | Admitting: Nurse Practitioner

## 2022-12-06 ENCOUNTER — Ambulatory Visit: Payer: 59 | Admitting: Dermatology

## 2022-12-06 ENCOUNTER — Encounter: Payer: Self-pay | Admitting: Dermatology

## 2022-12-06 VITALS — BP 135/87 | HR 86

## 2022-12-06 DIAGNOSIS — S21201A Unspecified open wound of right back wall of thorax without penetration into thoracic cavity, initial encounter: Secondary | ICD-10-CM | POA: Diagnosis not present

## 2022-12-06 DIAGNOSIS — R238 Other skin changes: Secondary | ICD-10-CM

## 2022-12-06 NOTE — Patient Instructions (Signed)
Apply vaseline jelly and bandaid daily until healed.  Wash with soap and water in shower.     Due to recent changes in healthcare laws, you may see results of your pathology and/or laboratory studies on MyChart before the doctors have had a chance to review them. We understand that in some cases there may be results that are confusing or concerning to you. Please understand that not all results are received at the same time and often the doctors may need to interpret multiple results in order to provide you with the best plan of care or course of treatment. Therefore, we ask that you please give [place] 2 business days to thoroughly review all your results before contacting the office for clarification. Should we see a critical lab result, you will be contacted sooner.   If You Need Anything After Your Visit  If you have any questions or concerns for your doctor, please call our main line at [phone] and press option 4 to reach your doctor's medical assistant. If no one answers, please leave a voicemail as directed and we will return your call as soon as possible. Messages left after 4 pm will be answered the following business day.   You may also send [place] a message via MyChart. We typically respond to MyChart messages within 1-2 business days.  For prescription refills, please ask your pharmacy to contact our office. Our fax number is [phone].  If you have an urgent issue when the clinic is closed that cannot wait until the next business day, you can page your doctor at the number below.    Please note that while we do our best to be available for urgent issues outside of office hours, we are not available 24/7.   If you have an urgent issue and are unable to reach [place], you may choose to seek medical care at your doctor's office, retail clinic, urgent care center, or emergency room.  If you have a medical emergency, please immediately call 911 or go to the emergency department.  Pager  Numbers  - Dr. Gwen Pounds: [phone]  - Dr. Neale Burly: [phone]  - Dr. Roseanne Reno: [phone]  In the event of inclement weather, please call our main line at [phone] for an update on the status of any delays or closures.  Dermatology Medication Tips: Please keep the boxes that topical medications come in in order to help keep track of the instructions about where and how to use these. Pharmacies typically print the medication instructions only on the boxes and not directly on the medication tubes.   If your medication is too expensive, please contact our office at [phone] option 4 or send [place] a message through MyChart.   We are unable to tell what your co-pay for medications will be in advance as this is different depending on your insurance coverage. However, we may be able to find a substitute medication at lower cost or fill out paperwork to get insurance to cover a needed medication.   If a prior authorization is required to get your medication covered by your insurance company, please allow [place] 1-2 business days to complete this process.  Drug prices often vary depending on where the prescription is filled and some pharmacies may offer cheaper prices.  The website www.goodrx.com contains coupons for medications through different pharmacies. The prices here do not account for what the cost may be with help from insurance (it may be cheaper with your insurance), but the website can give you the  price if you did not use any insurance.  - You can print the associated coupon and take it with your prescription to the pharmacy.  - You may also stop by our office during regular business hours and pick up a GoodRx coupon card.  - If you need your prescription sent electronically to a different pharmacy, notify our office through [facility] or by phone at [phone] option 4.     Si Usted Necesita Algo Despus de Su Visita  Tambin puede enviarnos un mensaje a travs de  Clinical cytogeneticist. Por lo general respondemos a los mensajes de MyChart en el transcurso de 1 a 2 das hbiles.  Para renovar recetas, por favor pida a su farmacia que se ponga en contacto con nuestra oficina. Annie Sable de fax es [place] [phone].  Si tiene un asunto urgente cuando la clnica est cerrada y que no puede esperar hasta el siguiente da hbil, puede llamar/localizar a su doctor(a) al nmero que aparece a continuacin.   Por favor, tenga en cuenta que aunque hacemos todo lo posible para estar disponibles para asuntos urgentes fuera del horario de [place], no estamos disponibles las 24 horas del da, los 7 [address]  [address] de la [place].   Si tiene un problema urgente y no puede comunicarse con nosotros, puede optar por buscar atencin mdica  en el consultorio de su doctor(a), en una clnica privada, en un centro de atencin urgente o en una sala de emergencias.  Si tiene Engineer, drilling, por favor llame inmediatamente al 911 o vaya a la sala de emergencias.  Nmeros de bper  - Dr. Gwen Pounds: [phone]  - Dra. Moye: [phone]  - Dra. Roseanne Reno: [phone]  En caso de inclemencias del [place], por favor llame a Lacy Duverney principal al [phone] para una actualizacin sobre el [place] de cualquier retraso o cierre.  Consejos para la medicacin en dermatologa: Por favor, guarde las cajas en las que vienen los medicamentos de uso tpico para ayudarle a seguir las instrucciones sobre dnde y cmo usarlos. Las farmacias generalmente imprimen las instrucciones del medicamento slo en las cajas y no directamente en los tubos del [place].   Si su medicamento es muy caro, por favor, pngase en contacto con Rolm Gala llamando al [phone] y presione la opcin 4 o envenos un mensaje a travs de Clinical cytogeneticist.   No podemos decirle cul ser su copago por los medicamentos por adelantado ya que esto es diferente dependiendo de la cobertura de su seguro. Sin embargo, es posible que  podamos encontrar un medicamento sustituto a Audiological scientist un formulario para que el seguro cubra el medicamento que se considera necesario.   Si se requiere una autorizacin previa para que su compaa de seguros [place] su medicamento, por favor permtanos de 1 a 2 das hbiles para completar [address]  Los precios de los medicamentos varan con frecuencia dependiendo del Environmental consultant de dnde se surte la receta y alguna farmacias pueden ofrecer precios ms baratos.  El sitio web www.goodrx.com tiene cupones para medicamentos de Health and safety inspector. Los precios aqu no tienen en cuenta lo que podra costar con la ayuda del seguro (puede ser ms barato con su seguro), pero el sitio web puede darle el precio si no utiliz Tourist information centre manager.  - Puede imprimir el cupn correspondiente y llevarlo con su receta a la farmacia.  - Tambin puede pasar por nuestra oficina durante el horario de atencin regular y Education officer, museum una tarjeta de cupones de GoodRx.  - Si necesita que  su receta se enve electrnicamente a una farmacia diferente, informe a nuestra oficina a travs de MyChart de [place] o por telfono llamando al [phone] y presione la opcin 4.

## 2022-12-06 NOTE — Progress Notes (Addendum)
   Follow-Up Visit   Subjective  Stacy Curry is a 52 y.o. female who presents for the following: Spot on back. Dur: 3 weeks. Popped up like a bump then got larger. Itches a lot. Denies pain or bleeding. Denies drainage. Does not recall injury to area. Referred by PCP for evaluation.   The patient has spots, moles and lesions to be evaluated, some may be new or changing and the patient may have concern these could be cancer.    The following portions of the chart were reviewed this encounter and updated as appropriate: medications, allergies, medical history  Review of Systems:  No other skin or systemic complaints except as noted in HPI or Assessment and Plan.  Objective  Well appearing patient in no apparent distress; mood and affect are within normal limits.  A focused examination was performed of the following areas: Back, abdomen, arms Relevant physical exam findings are noted in the Assessment and Plan.  Right Back Hemorrhagic crusting with mild scale and mild erythema surrounding Pink oval-shaped macule with collarette of scale on left lower back         Assessment & Plan   Wound of right side of back, initial encounter Right Back  Appears to be a healing wound. Differential includes sequela of inflamed EIC (no subcutaneous nodules palpated on exam) Unclear etiology. Offered biopsy for more information vs giving the area time to heal. Jointly decided to do wound care and wait for healing. Patient will make an appointment for follow up in case it has not resolved. Instructed patient to cancel appointment at least 24 hours in advance if lesion resolves.  Apply vaseline jelly and bandaid daily until healed.  Wash with soap and water in shower.    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 Thus, differential includes epidermal nevi vs congenital melanocytic nevi Recommend observation. Call clinic for new or changing moles. Recommend daily use of broad spectrum spf 30+ sunscreen to sun-exposed areas.    Return for wound recheck in 3-4 weeks.  I, Lawson Radar, CMA, am acting as scribe for Elie Goody, MD.   Documentation: I have reviewed the above documentation for accuracy and completeness, and I agree with the above.  Elie Goody, MD

## 2022-12-26 ENCOUNTER — Encounter: Payer: Self-pay | Admitting: Nurse Practitioner

## 2022-12-28 ENCOUNTER — Telehealth: Payer: Self-pay | Admitting: Nurse Practitioner

## 2022-12-28 NOTE — Telephone Encounter (Signed)
Orthopedic referral sent via Proficient to [facility] per patient. Notified patient. Gave pt telephone # [phone]

## 2023-01-03 ENCOUNTER — Ambulatory Visit: Payer: 59 | Admitting: Dermatology

## 2023-01-03 ENCOUNTER — Ambulatory Visit: Payer: 59

## 2023-01-05 ENCOUNTER — Telehealth: Payer: Self-pay | Admitting: Nurse Practitioner

## 2023-01-05 NOTE — Telephone Encounter (Signed)
Orthopedic appointment 01/10/2023 @ [facility]

## 2023-01-10 DIAGNOSIS — M545 Low back pain, unspecified: Secondary | ICD-10-CM | POA: Diagnosis not present

## 2023-01-10 DIAGNOSIS — M5136 Other intervertebral disc degeneration, lumbar region: Secondary | ICD-10-CM | POA: Diagnosis not present

## 2023-01-10 DIAGNOSIS — M62830 Muscle spasm of back: Secondary | ICD-10-CM | POA: Diagnosis not present

## 2023-01-10 DIAGNOSIS — G8929 Other chronic pain: Secondary | ICD-10-CM | POA: Diagnosis not present

## 2023-01-10 DIAGNOSIS — M47816 Spondylosis without myelopathy or radiculopathy, lumbar region: Secondary | ICD-10-CM | POA: Diagnosis not present

## 2023-01-10 DIAGNOSIS — M533 Sacrococcygeal disorders, not elsewhere classified: Secondary | ICD-10-CM | POA: Diagnosis not present

## 2023-01-13 ENCOUNTER — Telehealth: Payer: Self-pay

## 2023-01-13 NOTE — Telephone Encounter (Signed)
Pt notified about her eye exam was negative. No retinopathy.

## 2023-01-31 ENCOUNTER — Encounter: Payer: 59 | Admitting: Nurse Practitioner

## 2023-01-31 ENCOUNTER — Ambulatory Visit: Payer: 59

## 2023-01-31 ENCOUNTER — Encounter: Payer: Self-pay | Admitting: Nurse Practitioner

## 2023-02-01 ENCOUNTER — Other Ambulatory Visit: Payer: Self-pay

## 2023-02-01 DIAGNOSIS — M5441 Lumbago with sciatica, right side: Secondary | ICD-10-CM

## 2023-02-01 MED ORDER — IBUPROFEN 800 MG PO TABS
800.0000 mg | ORAL_TABLET | Freq: Three times a day (TID) | ORAL | 0 refills | Status: DC | PRN
Start: 2023-02-01 — End: 2023-06-08

## 2023-02-02 ENCOUNTER — Other Ambulatory Visit: Payer: Self-pay | Admitting: Nurse Practitioner

## 2023-02-02 DIAGNOSIS — E559 Vitamin D deficiency, unspecified: Secondary | ICD-10-CM

## 2023-02-04 ENCOUNTER — Other Ambulatory Visit: Payer: Self-pay | Admitting: Nurse Practitioner

## 2023-02-04 DIAGNOSIS — I10 Essential (primary) hypertension: Secondary | ICD-10-CM

## 2023-02-22 ENCOUNTER — Encounter: Payer: 59 | Admitting: Nurse Practitioner

## 2023-02-27 ENCOUNTER — Ambulatory Visit: Payer: 59 | Admitting: Nurse Practitioner

## 2023-03-02 ENCOUNTER — Encounter: Payer: Self-pay | Admitting: Nurse Practitioner

## 2023-03-09 ENCOUNTER — Other Ambulatory Visit: Payer: Self-pay | Admitting: Nurse Practitioner

## 2023-03-09 DIAGNOSIS — I10 Essential (primary) hypertension: Secondary | ICD-10-CM

## 2023-03-21 ENCOUNTER — Encounter: Payer: Self-pay | Admitting: Nurse Practitioner

## 2023-03-23 NOTE — Telephone Encounter (Signed)
Spoke with pt and make her appt with dfk

## 2023-03-29 ENCOUNTER — Other Ambulatory Visit
Admission: RE | Admit: 2023-03-29 | Discharge: 2023-03-29 | Disposition: A | Discharge status: Home / Self Care | Payer: Self-pay | Source: Ambulatory Visit | Attending: Internal Medicine | Admitting: Internal Medicine

## 2023-03-29 ENCOUNTER — Ambulatory Visit (INDEPENDENT_AMBULATORY_CARE_PROVIDER_SITE_OTHER): Payer: 59 | Admitting: Internal Medicine

## 2023-03-29 ENCOUNTER — Encounter: Payer: Self-pay | Admitting: Internal Medicine

## 2023-03-29 VITALS — BP 138/80 | HR 90 | Temp 97.8°F | Resp 16 | Ht 63.5 in | Wt 195.0 lb

## 2023-03-29 DIAGNOSIS — G8929 Other chronic pain: Secondary | ICD-10-CM

## 2023-03-29 DIAGNOSIS — M542 Cervicalgia: Secondary | ICD-10-CM | POA: Insufficient documentation

## 2023-03-29 DIAGNOSIS — M546 Pain in thoracic spine: Secondary | ICD-10-CM | POA: Insufficient documentation

## 2023-03-29 DIAGNOSIS — E1165 Type 2 diabetes mellitus with hyperglycemia: Secondary | ICD-10-CM | POA: Diagnosis not present

## 2023-03-29 DIAGNOSIS — M545 Low back pain, unspecified: Secondary | ICD-10-CM

## 2023-03-29 LAB — CKMB (ARMC ONLY): CK, MB: 1.5 ng/mL (ref 0.5–5.0)

## 2023-03-29 LAB — SEDIMENTATION RATE: Sed Rate: 9 mm/h (ref 0–30)

## 2023-03-29 LAB — CK: Total CK: 71 U/L (ref 38–234)

## 2023-03-29 LAB — URIC ACID: Uric Acid, Serum: 4.9 mg/dL (ref 2.5–7.1)

## 2023-03-29 MED ORDER — DULOXETINE HCL 20 MG PO CPEP
20.0000 mg | ORAL_CAPSULE | Freq: Every day | ORAL | 3 refills | Status: AC
Start: 2023-03-29 — End: ?

## 2023-03-29 NOTE — Progress Notes (Signed)
[facility] [address] 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  Does not exercise or engage in any other wt bearing activities  She is menopausal, has hot flashes but does not wish to be on HRT  Blood pressure is controlled with medications  Recent hg A1c at work is 6.4 Takes 2 different types muscle relaxant ( Methocarbamol in am and Flexeril in pm) but prn    Current Medication: Outpatient Encounter Medications as of 03/29/2023  Medication Sig   Accu-Chek Softclix Lancets lancets Use as instructed   amLODipine (NORVASC) 5 MG tablet Take 1 tablet by mouth once daily   cyanocobalamin (VITAMIN B12) 1000 MCG/ML injection Inject 1 mL (1,000 mcg total) into the muscle every 30 (thirty) days.   cyclobenzaprine (FLEXERIL) 10 MG tablet Take 0.5-1 tablets (5-10 mg total) by mouth at bedtime as needed (musculoskeletal pain).   glucose blood test strip Use as instructed   hydrochlorothiazide (HYDRODIURIL) 12.5 MG tablet Take 1 tablet (12.5 mg total) by mouth daily as needed (lower extremity swelling/fluid retention.). Only take in the morning   ibuprofen (ADVIL) 800 MG tablet Take 1 tablet (800 mg total) by mouth every 8 (eight) hours as needed for moderate pain.   losartan-hydrochlorothiazide (HYZAAR) 50-12.5 MG tablet Take 1 tablet by mouth once daily   methocarbamol (ROBAXIN) 500 MG tablet Take 1 tablet (500 mg total) by mouth every 8 (eight) hours as needed for muscle spasms.   Multiple Vitamin (MULTIVITAMIN) tablet Take 1 tablet by mouth daily.   SYRINGE-NEEDLE, DISP, 3 ML (B-D 3CC LUER-LOK SYR 21GX1") 21G X  1" 3 ML MISC Use 1 syringe and needle to inject B12 every 30 days   Vitamin D, Ergocalciferol, (DRISDOL) 1.25 MG (50000 UNIT) CAPS capsule Take 1 capsule by mouth once a week   [DISCONTINUED] metroNIDAZOLE (FLAGYL) 500 MG tablet Take 1 tablet (500 mg total) by mouth 2 (two) times daily. For 7 days   [DISCONTINUED] predniSONE (STERAPRED UNI-PAK 21 TAB) 10 MG (21) TBPK tablet Use as directed for 6 days   [DISCONTINUED] meloxicam (MOBIC) 15 MG tablet Take 1 tablet (15 mg total) by mouth daily. In am with breakfast (Patient not taking: Reported on 03/29/2023)   Facility-Administered Encounter Medications as of 03/29/2023  Medication   triamcinolone acetonide (KENALOG-40) injection 40 mg    Surgical History: Past Surgical History:  Procedure Laterality Date   TUBAL LIGATION      Medical History: Past Medical History:  Diagnosis Date   Allergy    Chicken pox age 18   Diabetes mellitus without complication (HCC)    Hypertension     Family History: Family History  Problem Relation Age of Onset   Cancer Mother    Diabetes Father    Heart attack Maternal Grandmother    Breast cancer Neg Hx     Social History   Socioeconomic History   Marital status: Single    Spouse name: Not on file   Number of children: Not on file   Years of education: Not on file  Highest education level: Not on file  Occupational History   Not on file  Tobacco Use   Smoking status: Some Days    Current packs/day: 0.00    Types: Cigarettes, E-cigarettes    Last attempt to quit: 05/09/2021    Years since quitting: 1.8   Smokeless tobacco: Never  Vaping Use   Vaping status: Never Used  Substance and Sexual Activity   Alcohol use: Yes    Comment: 1-2 per month (wine or mixed)   Drug use: Never   Sexual activity: Yes    Partners: Male  Other Topics Concern   Not on file  Social History Narrative   Not on file   Social Determinants of Health   Financial Resource Strain: Not on file  Food  Insecurity: Not on file  Transportation Needs: Not on file  Physical Activity: Not on file  Stress: Not on file  Social Connections: Not on file  Intimate Partner Violence: Not on file      Review of Systems  Constitutional:  Negative for fatigue and fever.  HENT:  Negative for congestion, mouth sores and postnasal drip.   Respiratory:  Negative for cough.   Cardiovascular:  Negative for chest pain.  Genitourinary:  Negative for flank pain.  Psychiatric/Behavioral: Negative.      Vital Signs: BP 138/80   Pulse 90   Temp 97.8 F (36.6 C)   Resp 16   Ht 5' 3.5" (1.613 m)   Wt 195 lb (88.5 kg)   LMP 10/17/2017   SpO2 94%   BMI 34.00 kg/m    Physical Exam Constitutional:      Appearance: Normal appearance.  HENT:     Head: Normocephalic and atraumatic.     Nose: Nose normal.     Mouth/Throat:     Mouth: Mucous membranes are moist.     Pharynx: No posterior oropharyngeal erythema.  Eyes:     Extraocular Movements: Extraocular movements intact.     Pupils: Pupils are equal, round, and reactive to light.  Cardiovascular:     Pulses: Normal pulses.     Heart sounds: Normal heart sounds.  Pulmonary:     Effort: Pulmonary effort is normal.     Breath sounds: Normal breath sounds.  Musculoskeletal:        General: Tenderness present.     Comments: Upper back and lower back   Neurological:     General: No focal deficit present.     Mental Status: She is alert.     Gait: Gait abnormal.  Psychiatric:        Mood and Affect: Mood normal.        Behavior: Behavior normal.        Assessment/Plan: 1. Acute midline thoracic back pain Pt has multiple tender areas on her back ?? Fibromylagia however does not have symptoms of fatigue, no insomnia, disturbed sleep at times  Will add low dose Cymbalta, pt is also menopausal, might need bmd  - Sed Rate (ESR) - Uric acid - CYCLIC CITRUL PEPTIDE ANTIBODY, IGG/IGA - CK Total (and CKMB) - Ambulatory referral to Physical  Therapy  2. Neck pain As above, will get evaluation from PT, needs stretching exercises  - Sed Rate (ESR) - Uric acid - CYCLIC CITRUL PEPTIDE ANTIBODY, IGG/IGA - CK Total (and CKMB) - Ambulatory referral to Physical Therapy  3. Chronic bilateral low back pain without sciatica As above   4. Uncontrolled type 2 diabetes mellitus with hyperglycemia (HCC) Improved with  hg A1c 6.4, done at work  - Microalbumin / creatinine urine ratio - Sed Rate (ESR) - Uric acid - CYCLIC CITRUL PEPTIDE ANTIBODY, IGG/IGA - CK Total (and CKMB) - Ambulatory referral to Physical Therapy   General Counseling: Shamicka verbalizes understanding of the findings of todays visit and agrees with plan of treatment. I have discussed any further diagnostic evaluation that may be needed or ordered today. We also reviewed her medications today. she has been encouraged to call the office with any questions or concerns that should arise related to todays visit.    Orders Placed This Encounter  Procedures   Microalbumin / creatinine urine ratio    No orders of the defined types were placed in this encounter.   Total time spent:35 Minutes Time spent includes review of chart, medications, test results, and follow up plan with the patient.   [place] Controlled Substance Database was reviewed by me.   Dr Lyndon Code Internal medicine

## 2023-03-30 ENCOUNTER — Telehealth: Payer: Self-pay | Admitting: Nurse Practitioner

## 2023-03-30 LAB — CYCLIC CITRUL PEPTIDE ANTIBODY, IGG/IGA: CCP Antibodies IgG/IgA: 6 U (ref 0–19)

## 2023-03-30 NOTE — Telephone Encounter (Signed)
PT referral faxed to [facility] in [place] ; [phone]. Notified patient. Gave pt telephone # [phone]

## 2023-03-31 LAB — MICROALBUMIN / CREATININE URINE RATIO
Creatinine, Urine: 28.7 mg/dL
Microalb/Creat Ratio: <10 mg/g{creat} (ref 0–29)
Microalbumin, Urine: <3 ug/mL

## 2023-04-10 ENCOUNTER — Ambulatory Visit (INDEPENDENT_AMBULATORY_CARE_PROVIDER_SITE_OTHER): Payer: 59 | Admitting: Nurse Practitioner

## 2023-04-10 ENCOUNTER — Encounter: Payer: Self-pay | Admitting: Nurse Practitioner

## 2023-04-10 VITALS — BP 125/72 | HR 77 | Temp 98.3°F | Resp 16 | Ht 63.5 in | Wt 196.0 lb

## 2023-04-10 DIAGNOSIS — Z1231 Encounter for screening mammogram for malignant neoplasm of breast: Secondary | ICD-10-CM | POA: Diagnosis not present

## 2023-04-10 DIAGNOSIS — L989 Disorder of the skin and subcutaneous tissue, unspecified: Secondary | ICD-10-CM | POA: Diagnosis not present

## 2023-04-10 DIAGNOSIS — I152 Hypertension secondary to endocrine disorders: Secondary | ICD-10-CM

## 2023-04-10 DIAGNOSIS — E1159 Type 2 diabetes mellitus with other circulatory complications: Secondary | ICD-10-CM | POA: Diagnosis not present

## 2023-04-10 DIAGNOSIS — Z0001 Encounter for general adult medical examination with abnormal findings: Secondary | ICD-10-CM

## 2023-04-10 DIAGNOSIS — Z119 Encounter for screening for infectious and parasitic diseases, unspecified: Secondary | ICD-10-CM | POA: Diagnosis not present

## 2023-04-10 MED ORDER — AMLODIPINE BESYLATE 5 MG PO TABS: 5.0000 mg | ORAL_TABLET | Freq: Every day | ORAL | 1 refills | Status: DC

## 2023-04-10 MED ORDER — LOSARTAN POTASSIUM-HCTZ 50-12.5 MG PO TABS: 1.0000 | ORAL_TABLET | Freq: Every day | ORAL | 1 refills | Status: DC

## 2023-04-10 NOTE — Progress Notes (Signed)
[facility] [address] [place], [place] [address]  Internal MEDICINE  Office Visit Note  Patient Name: Stacy Curry  [id number]  [id number]  Date of Service: 04/10/2023  Chief Complaint  Patient presents with   Diabetes   Hypertension   Annual Exam    HPI Stacy Curry presents for an annual well visit and physical exam.  Well-appearing 52 y.o. female with chronic low back and hip pain, hypertension and hyperlipidemia  Routine CRC screening: cologuard due in march 2025 Routine mammogram: due now  Pap smear: due in 2027 Eye exam done in August this year  foot exam: done today  Labs: routine labs due in April 2025 New or worsening pain: chronic back pain  Seen by Dr. Welton Flakes recently-- lab results were normal.  On FMLA for back pain, wants to extend it, patient will call her short term disability persons.  Going to try physical therapy.  Chiropractor was suggested but patient declined.    Current Medication: Outpatient Encounter Medications as of 04/10/2023  Medication Sig   Accu-Chek Softclix Lancets lancets Use as instructed   cyanocobalamin (VITAMIN B12) 1000 MCG/ML injection Inject 1 mL (1,000 mcg total) into the muscle every 30 (thirty) days.   cyclobenzaprine (FLEXERIL) 10 MG tablet Take 0.5-1 tablets (5-10 mg total) by mouth at bedtime as needed (musculoskeletal pain).   DULoxetine (CYMBALTA) 20 MG capsule Take 1 capsule (20 mg total) by mouth daily.   glucose blood test strip Use as instructed   ibuprofen (ADVIL) 800 MG tablet Take 1 tablet (800 mg total) by mouth every 8 (eight) hours as needed for moderate pain.   methocarbamol (ROBAXIN) 500 MG tablet Take 1 tablet (500 mg total) by mouth every 8 (eight) hours as needed for muscle spasms.   Multiple Vitamin (MULTIVITAMIN) tablet Take 1 tablet by mouth daily.   SYRINGE-NEEDLE, DISP, 3 ML (B-D 3CC LUER-LOK SYR 21GX1") 21G X 1" 3 ML MISC Use 1 syringe and needle to inject B12 every 30 days   Vitamin D,  Ergocalciferol, (DRISDOL) 1.25 MG (50000 UNIT) CAPS capsule Take 1 capsule by mouth once a week   [DISCONTINUED] amLODipine (NORVASC) 5 MG tablet Take 1 tablet by mouth once daily   [DISCONTINUED] losartan-hydrochlorothiazide (HYZAAR) 50-12.5 MG tablet Take 1 tablet by mouth once daily   amLODipine (NORVASC) 5 MG tablet Take 1 tablet (5 mg total) by mouth daily.   losartan-hydrochlorothiazide (HYZAAR) 50-12.5 MG tablet Take 1 tablet by mouth daily.   Facility-Administered Encounter Medications as of 04/10/2023  Medication   triamcinolone acetonide (KENALOG-40) injection 40 mg    Surgical History: Past Surgical History:  Procedure Laterality Date   TUBAL LIGATION      Medical History: Past Medical History:  Diagnosis Date   Allergy    Chicken pox age 76   Diabetes mellitus without complication (HCC)    Hypertension     Family History: Family History  Problem Relation Age of Onset   Cancer Mother    Diabetes Father    Heart attack Maternal Grandmother    Breast cancer Neg Hx     Social History   Socioeconomic History   Marital status: Single    Spouse name: Not on file   Number of children: Not on file   Years of education: Not on file   Highest education level: Not on file  Occupational History   Not on file  Tobacco Use   Smoking status: Some Days    Current packs/day: 0.00  Types: Cigarettes, E-cigarettes    Last attempt to quit: 05/09/2021    Years since quitting: 1.9   Smokeless tobacco: Never  Vaping Use   Vaping status: Never Used  Substance and Sexual Activity   Alcohol use: Yes    Comment: 1-2 per month (wine or mixed)   Drug use: Never   Sexual activity: Yes    Partners: Male  Other Topics Concern   Not on file  Social History Narrative   Not on file   Social Determinants of Health   Financial Resource Strain: Not on file  Food Insecurity: Not on file  Transportation Needs: Not on file  Physical Activity: Not on file  Stress: Not on  file  Social Connections: Not on file  Intimate Partner Violence: Not on file      Review of Systems  Constitutional:  Negative for activity change, appetite change, chills, fatigue, fever and unexpected weight change.  HENT: Negative.  Negative for congestion, ear pain, rhinorrhea, sore throat and trouble swallowing.   Eyes: Negative.   Respiratory: Negative.  Negative for cough, chest tightness, shortness of breath and wheezing.   Cardiovascular: Negative.  Negative for chest pain and palpitations.  Gastrointestinal: Negative.  Negative for abdominal pain, blood in stool, constipation, diarrhea, nausea and vomiting.  Endocrine: Negative.   Genitourinary: Negative.  Negative for difficulty urinating, dysuria, frequency, hematuria and urgency.  Musculoskeletal:  Positive for arthralgias and back pain. Negative for joint swelling, myalgias and neck pain.  Skin: Negative.  Negative for rash and wound.  Allergic/Immunologic: Negative.  Negative for immunocompromised state.  Neurological: Negative.  Negative for dizziness, seizures, numbness and headaches.  Hematological: Negative.   Psychiatric/Behavioral: Negative.  Negative for behavioral problems, self-injury and suicidal ideas. The patient is not nervous/anxious.     Vital Signs: BP 125/72 Comment: 140/88  Pulse 77   Temp 98.3 F (36.8 C)   Resp 16   Ht 5' 3.5" (1.613 m)   Wt 196 lb (88.9 kg)   LMP 10/17/2017   SpO2 93%   BMI 34.18 kg/m    Physical Exam Vitals reviewed.  Constitutional:      General: She is awake. She is not in acute distress.    Appearance: Normal appearance. She is well-developed and well-groomed. She is not ill-appearing or diaphoretic.  HENT:     Head: Normocephalic and atraumatic.     Right Ear: Tympanic membrane, ear canal and external ear normal.     Left Ear: Tympanic membrane, ear canal and external ear normal.     Nose: Nose normal. No congestion or rhinorrhea.     Mouth/Throat:     Mouth:  Mucous membranes are moist.     Pharynx: Oropharynx is clear. No oropharyngeal exudate or posterior oropharyngeal erythema.  Eyes:     General: No scleral icterus.       Right eye: No discharge.        Left eye: No discharge.     Extraocular Movements: Extraocular movements intact.     Conjunctiva/sclera: Conjunctivae normal.     Pupils: Pupils are equal, round, and reactive to light.  Neck:     Thyroid: No thyromegaly.     Vascular: No JVD.     Trachea: No tracheal deviation.  Cardiovascular:     Rate and Rhythm: Normal rate and regular rhythm.     Pulses: Normal pulses.     Heart sounds: Normal heart sounds. No murmur heard.    No friction rub. No gallop.  Pulmonary:     Effort: Pulmonary effort is normal. No respiratory distress.     Breath sounds: Normal breath sounds. No stridor. No wheezing or rales.  Chest:     Chest wall: No tenderness.  Abdominal:     General: Bowel sounds are normal. There is no distension.     Palpations: Abdomen is soft. There is no mass.     Tenderness: There is no abdominal tenderness. There is no guarding or rebound.     Hernia: There is no hernia in the left inguinal area or right inguinal area.  Genitourinary:    General: Normal vulva.     Exam position: Lithotomy position.     Pubic Area: No rash or pubic lice.      Labia:        Right: No rash, tenderness, lesion or injury.        Left: No rash, tenderness, lesion or injury.      Urethra: No prolapse, urethral swelling or urethral lesion.     Vagina: Normal. No signs of injury and foreign body. No vaginal discharge, erythema, tenderness, bleeding, lesions or prolapsed vaginal walls.     Cervix: No cervical motion tenderness, discharge, friability, lesion, erythema, cervical bleeding or eversion.     Uterus: Normal. Not deviated, not enlarged, not fixed, not tender and no uterine prolapse.      Adnexa: Right adnexa normal and left adnexa normal.     Rectum: Normal. No tenderness, anal fissure  or external hemorrhoid. Normal anal tone.  Musculoskeletal:        General: No tenderness or deformity. Normal range of motion.     Cervical back: Normal range of motion and neck supple.  Lymphadenopathy:     Cervical: No cervical adenopathy.     Lower Body: No right inguinal adenopathy. No left inguinal adenopathy.  Skin:    General: Skin is warm and dry.     Capillary Refill: Capillary refill takes less than 2 seconds.     Coloration: Skin is not pale.     Findings: No erythema or rash.  Neurological:     Mental Status: She is alert and oriented to person, place, and time.     Cranial Nerves: No cranial nerve deficit.     Motor: No abnormal muscle tone.     Coordination: Coordination normal.     Gait: Gait normal.     Deep Tendon Reflexes: Reflexes are normal and symmetric.  Psychiatric:        Mood and Affect: Mood normal.        Behavior: Behavior normal. Behavior is cooperative.        Thought Content: Thought content normal.        Judgment: Judgment normal.        Assessment/Plan: 1. Encounter for routine adult health examination with abnormal findings Age-appropriate preventive screenings and vaccinations discussed, annual physical exam completed. Routine labs for health maintenance due in April next year. PHM updated.   2. Type 2 diabetes mellitus with other circulatory complication, without long-term current use of insulin (HCC) associated with hypertension  Diet controlled. Continue diet and lifestyle modifications. Will repeat A1c in a few more months.   3. Hypertension associated with type 2 diabetes mellitus (HCC) Continue amlodipine and losartan-hydrochlorothiazide as prescribed.  - amLODipine (NORVASC) 5 MG tablet; Take 1 tablet (5 mg total) by mouth daily.  Dispense: 90 tablet; Refill: 1 - losartan-hydrochlorothiazide (HYZAAR) 50-12.5 MG tablet; Take 1 tablet by mouth daily.  Dispense: 90 tablet; Refill: 1  4. Skin lesions Referred to dermatology  -  Ambulatory referral to Dermatology  5. Encounter for screening mammogram for malignant neoplasm of breast Routine mammogram ordered - MM 3D SCREENING MAMMOGRAM BILATERAL BREAST; Future  6. Encounter for screening examination for infectious disease Lab ordered  - STI Profile      General Counseling: Preslynn verbalizes understanding of the findings of todays visit and agrees with plan of treatment. I have discussed any further diagnostic evaluation that may be needed or ordered today. We also reviewed her medications today. she has been encouraged to call the office with any questions or concerns that should arise related to todays visit.    Orders Placed This Encounter  Procedures   MM 3D SCREENING MAMMOGRAM BILATERAL BREAST   STI Profile   Ambulatory referral to Dermatology    Meds ordered this encounter  Medications   amLODipine (NORVASC) 5 MG tablet    Sig: Take 1 tablet (5 mg total) by mouth daily.    Dispense:  90 tablet    Refill:  1   losartan-hydrochlorothiazide (HYZAAR) 50-12.5 MG tablet    Sig: Take 1 tablet by mouth daily.    Dispense:  90 tablet    Refill:  1    Return in about 5 months (around 09/08/2023) for F/U, Recheck A1C, Orelia Brandstetter PCP.   Total time spent:30 Minutes Time spent includes review of chart, medications, test results, and follow up plan with the patient.   [place] Controlled Substance Database was reviewed by me.  This patient was seen by Sallyanne Kuster, FNP-C in collaboration with Dr. Beverely Risen as a part of collaborative care agreement.  Jiyan Walkowski R. Tedd Sias, MSN, FNP-C Internal medicine

## 2023-04-17 ENCOUNTER — Encounter: Payer: Self-pay | Admitting: Dermatology

## 2023-04-17 ENCOUNTER — Ambulatory Visit (INDEPENDENT_AMBULATORY_CARE_PROVIDER_SITE_OTHER): Payer: 59 | Admitting: Dermatology

## 2023-04-17 DIAGNOSIS — L410 Pityriasis lichenoides et varioliformis acuta: Secondary | ICD-10-CM

## 2023-04-17 DIAGNOSIS — R21 Rash and other nonspecific skin eruption: Secondary | ICD-10-CM | POA: Diagnosis not present

## 2023-04-17 NOTE — Patient Instructions (Signed)

## 2023-04-17 NOTE — Progress Notes (Signed)
   Follow-Up Visit   Subjective  Stacy Curry is a 52 y.o. female who presents for the following: itchy spots at trunk, arms and legs. Started over the last month. Patient advises a spot will come up kind of scaly and then it looks discolored.   The patient has spots, moles and lesions to be evaluated, some may be new or changing and the patient may have concern these could be cancer.   The following portions of the chart were reviewed this encounter and updated as appropriate: medications, allergies, medical history  Review of Systems:  No other skin or systemic complaints except as noted in HPI or Assessment and Plan.  Objective  Well appearing patient in no apparent distress; mood and affect are within normal limits.   A focused examination was performed of the following areas: Trunk, arms, legs  Relevant exam findings are noted in the Assessment and Plan.  Left Thigh - Anterior Violaceous scaly oval papules with subQ induration scattered on extremities and trunk               Assessment & Plan     Rash Left Thigh - Anterior  Skin / nail biopsy - Left Thigh - Anterior Type of biopsy: punch   Informed consent: discussed and consent obtained   Timeout: patient name, date of birth, surgical site, and procedure verified   Procedure prep:  Patient was prepped and draped in usual sterile fashion Prep type:  Isopropyl alcohol Anesthesia: the lesion was anesthetized in a standard fashion   Anesthetic:  1% lidocaine w/ epinephrine 1-100,000 buffered w/ 8.4% NaHCO3 Punch size:  4 mm Suture size:  4-0 Suture type: nylon   Hemostasis achieved with: suture, pressure and aluminum chloride   Outcome: patient tolerated procedure well   Post-procedure details: sterile dressing applied and wound care instructions given    Specimen 1 - Surgical pathology Differential Diagnosis: lichen planus vs pityriasis rosea vs syphilis vs guttate psoriasis vs eczema  Check Margins:  No Violaceous scaly oval papules with subQ induration scattered on extremities and trunk   Wound of right side of back  Resolved with no sequelae  Return in about 1 week (around 04/24/2023) for Suture Removal.  Anise Salvo, RMA, am acting as scribe for Elie Goody, MD .   Documentation: I have reviewed the above documentation for accuracy and completeness, and I agree with the above.  Elie Goody, MD

## 2023-04-19 ENCOUNTER — Encounter: Payer: Self-pay | Admitting: Nurse Practitioner

## 2023-04-19 LAB — SURGICAL PATHOLOGY

## 2023-04-19 NOTE — Telephone Encounter (Signed)
Please see this

## 2023-04-19 NOTE — Telephone Encounter (Signed)
Note is done and patient is coming tomorrow to get it.

## 2023-04-20 ENCOUNTER — Encounter: Payer: Self-pay | Admitting: Nurse Practitioner

## 2023-04-23 ENCOUNTER — Other Ambulatory Visit: Payer: Self-pay | Admitting: Nurse Practitioner

## 2023-04-23 DIAGNOSIS — E559 Vitamin D deficiency, unspecified: Secondary | ICD-10-CM

## 2023-04-24 ENCOUNTER — Encounter: Payer: Self-pay | Admitting: Dermatology

## 2023-04-24 ENCOUNTER — Ambulatory Visit (INDEPENDENT_AMBULATORY_CARE_PROVIDER_SITE_OTHER): Payer: 59 | Admitting: Dermatology

## 2023-04-24 DIAGNOSIS — L410 Pityriasis lichenoides et varioliformis acuta: Secondary | ICD-10-CM | POA: Diagnosis not present

## 2023-04-24 DIAGNOSIS — Z4802 Encounter for removal of sutures: Secondary | ICD-10-CM

## 2023-04-24 MED ORDER — DOXYCYCLINE MONOHYDRATE 100 MG PO CAPS: 100.0000 mg | ORAL_CAPSULE | Freq: Two times a day (BID) | ORAL | 1 refills | Status: DC

## 2023-04-24 NOTE — Progress Notes (Signed)
   Follow-Up Visit   Subjective  Stacy Curry is a 52 y.o. female who presents for the following: Suture removal and discuss pathology results. Discuss treatment options.   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  Objective  Well appearing patient in no apparent distress; mood and affect are within normal limits.  Areas Examined: Left thigh  Relevant physical exam findings are noted in the Assessment and Plan.    Assessment & Plan   PLEVA (pityriasis lichenoides et varioliformis acuta)  Related Procedures Hep B Surface Antibody Hep B Surface Antigen Hepatitis B Core AB, Total Hep C Antibody HIV antibody (with reflex) RPR Epstein-Barr virus VCA, IgG Epstein-Barr virus VCA, IgM   Encounter for Removal of Sutures - Incision site is clean, dry and intact. - Wound cleansed, sutures removed, wound cleansed and steri strips applied.  - Discussed pathology results showing PITYRIASIS LICHENOIDES ET VARIOLIFORMIS ACUTA (PLEVA).  - Patient advised to keep steri-strips dry until they fall off. - Scars remodel for a full year. - Once steri-strips fall off, patient can apply over-the-counter silicone scar cream once to twice a day to help with scar remodeling if desired. - Patient advised to call with any concerns or if they notice any new or changing lesions.  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 Sending labs to rule out infectious causes. Tetracycline has been shown to shorten duration. Sending doxy due to superior side effect profile Start Doxycycline 100 mg twice daily with  food. Take for full 2 months.   Doxycycline should be taken with food to prevent nausea. Do not lay down for 30 minutes after taking. Be cautious with sun exposure and use good sun protection while on this medication. Pregnant women should not take this medication.    Labs ordered today.   Return if symptoms worsen or fail to improve.  I, Lawson Radar, CMA, am acting as scribe for Elie Goody, MD.   Documentation: I have reviewed the above documentation for accuracy and completeness, and I agree with the above.  Elie Goody, MD

## 2023-04-24 NOTE — Patient Instructions (Addendum)
Start Doxycycline 100 mg twice daily with food. Take for full 2 months.   Doxycycline should be taken with food to prevent nausea. Do not lay down for 30 minutes after taking. Be cautious with sun exposure and use good sun protection while on this medication. Pregnant women should not take this medication.     Due to recent changes in healthcare laws, you may see results of your pathology and/or laboratory studies on MyChart before the doctors have had a chance to review them. We understand that in some cases there may be results that are confusing or concerning to you. Please understand that not all results are received at the same time and often the doctors may need to interpret multiple results in order to provide you with the best plan of care or course of treatment. Therefore, we ask that you please give [place] 2 business days to thoroughly review all your results before contacting the office for clarification. Should we see a critical lab result, you will be contacted sooner.   If You Need Anything After Your Visit  If you have any questions or concerns for your doctor, please call our main line at [phone] and press option 4 to reach your doctor's medical assistant. If no one answers, please leave a voicemail as directed and we will return your call as soon as possible. Messages left after 4 pm will be answered the following business day.   You may also send [place] a message via MyChart. We typically respond to MyChart messages within 1-2 business days.  For prescription refills, please ask your pharmacy to contact our office. Our fax number is [phone].  If you have an urgent issue when the clinic is closed that cannot wait until the next business day, you can page your doctor at the number below.    Please note that while we do our best to be available for urgent issues outside of office hours, we are not available 24/7.   If you have an urgent issue and are unable to reach [place], you may  choose to seek medical care at your doctor's office, retail clinic, urgent care center, or emergency room.  If you have a medical emergency, please immediately call 911 or go to the emergency department.  Pager Numbers  - Dr. Gwen Pounds: [phone]  - Dr. Roseanne Reno: [phone]  - Dr. Katrinka Blazing: [phone]   In the event of inclement weather, please call our main line at [phone] for an update on the status of any delays or closures.  Dermatology Medication Tips: Please keep the boxes that topical medications come in in order to help keep track of the instructions about where and how to use these. Pharmacies typically print the medication instructions only on the boxes and not directly on the medication tubes.   If your medication is too expensive, please contact our office at [phone] option 4 or send [place] a message through MyChart.   We are unable to tell what your co-pay for medications will be in advance as this is different depending on your insurance coverage. However, we may be able to find a substitute medication at lower cost or fill out paperwork to get insurance to cover a needed medication.   If a prior authorization is required to get your medication covered by your insurance company, please allow [place] 1-2 business days to complete this process.  Drug prices often vary depending on where the prescription is filled and some pharmacies may offer cheaper prices.  The  website www.goodrx.com contains coupons for medications through different pharmacies. The prices here do not account for what the cost may be with help from insurance (it may be cheaper with your insurance), but the website can give you the price if you did not use any insurance.  - You can print the associated coupon and take it with your prescription to the pharmacy.  - You may also stop by our office during regular business hours and pick up a GoodRx coupon card.  - If you need your prescription sent  electronically to a different pharmacy, notify our office through [facility] or by phone at [phone] option 4.     Si Usted Necesita Algo Despus de Su Visita  Tambin puede enviarnos un mensaje a travs de Clinical cytogeneticist. Por lo general respondemos a los mensajes de MyChart en el transcurso de 1 a 2 das hbiles.  Para renovar recetas, por favor pida a su farmacia que se ponga en contacto con nuestra oficina. Annie Sable de fax es [place] [phone].  Si tiene un asunto urgente cuando la clnica est cerrada y que no puede esperar hasta el siguiente da hbil, puede llamar/localizar a su doctor(a) al nmero que aparece a continuacin.   Por favor, tenga en cuenta que aunque hacemos todo lo posible para estar disponibles para asuntos urgentes fuera del horario de [place], no estamos disponibles las 24 horas del da, los 7 [address]  [address] de la [place].   Si tiene un problema urgente y no puede comunicarse con nosotros, puede optar por buscar atencin mdica  en el consultorio de su doctor(a), en una clnica privada, en un centro de atencin urgente o en una sala de emergencias.  Si tiene Engineer, drilling, por favor llame inmediatamente al 911 o vaya a la sala de emergencias.  Nmeros de bper  - Dr. Gwen Pounds: [phone]  - Dra. Roseanne Reno: [phone]  - Dr. Katrinka Blazing: [phone]   En caso de inclemencias del tiempo, por favor llame a Lacy Duverney principal al [phone] para una actualizacin sobre el [place] de cualquier retraso o cierre.  Consejos para la medicacin en dermatologa: Por favor, guarde las cajas en las que vienen los medicamentos de uso tpico para ayudarle a seguir las instrucciones sobre dnde y cmo usarlos. Las farmacias generalmente imprimen las instrucciones del medicamento slo en las cajas y no directamente en los tubos del [place].   Si su medicamento es muy caro, por favor, pngase en contacto con Rolm Gala llamando al [phone] y presione la  opcin 4 o envenos un mensaje a travs de Clinical cytogeneticist.   No podemos decirle cul ser su copago por los medicamentos por adelantado ya que esto es diferente dependiendo de la cobertura de su seguro. Sin embargo, es posible que podamos encontrar un medicamento sustituto a Audiological scientist un formulario para que el seguro cubra el medicamento que se considera necesario.   Si se requiere una autorizacin previa para que su compaa de seguros [place] su medicamento, por favor permtanos de 1 a 2 das hbiles para completar [address]  Los precios de los medicamentos varan con frecuencia dependiendo del Environmental consultant de dnde se surte la receta y alguna farmacias pueden ofrecer precios ms baratos.  El sitio web www.goodrx.com tiene cupones para medicamentos de Health and safety inspector. Los precios aqu no tienen en cuenta lo que podra costar con la ayuda del seguro (puede ser ms barato con su seguro), pero el sitio web puede darle el precio si no utiliz Tourist information centre manager.  -  Puede imprimir el cupn correspondiente y llevarlo con su receta a la farmacia.  - Tambin puede pasar por nuestra oficina durante el horario de atencin regular y Education officer, museum una tarjeta de cupones de GoodRx.  - Si necesita que su receta se enve electrnicamente a una farmacia diferente, informe a nuestra oficina a travs de MyChart de [place] o por telfono llamando al [phone] y presione la opcin 4.

## 2023-04-26 ENCOUNTER — Encounter: Payer: Self-pay | Admitting: Nurse Practitioner

## 2023-05-03 ENCOUNTER — Other Ambulatory Visit
Admission: RE | Admit: 2023-05-03 | Discharge: 2023-05-03 | Disposition: A | Discharge status: Home / Self Care | Payer: 59 | Attending: Dermatology | Admitting: Dermatology

## 2023-05-03 DIAGNOSIS — L410 Pityriasis lichenoides et varioliformis acuta: Secondary | ICD-10-CM | POA: Diagnosis not present

## 2023-05-03 LAB — HEPATITIS C ANTIBODY: HCV Ab: NONREACTIVE

## 2023-05-03 LAB — HIV ANTIBODY (ROUTINE TESTING W REFLEX): HIV Screen 4th Generation wRfx: NONREACTIVE

## 2023-05-03 LAB — HEPATITIS B SURFACE ANTIGEN: Hepatitis B Surface Ag: NONREACTIVE

## 2023-05-03 LAB — HEPATITIS B CORE ANTIBODY, TOTAL: Hep B Core Total Ab: NONREACTIVE

## 2023-05-04 LAB — HEPATITIS B SURFACE ANTIBODY, QUANTITATIVE: Hep B S AB Quant (Post): 13.4 m[IU]/mL

## 2023-05-04 LAB — EPSTEIN-BARR VIRUS VCA, IGG: EBV VCA IgG: >600 U/mL — ABNORMAL HIGH (ref 0.0–17.9)

## 2023-05-04 LAB — EPSTEIN-BARR VIRUS VCA, IGM: EBV VCA IgM: <36 U/mL (ref 0.0–35.9)

## 2023-05-04 LAB — RPR: RPR Ser Ql: NONREACTIVE

## 2023-05-08 ENCOUNTER — Encounter: Payer: Self-pay | Admitting: Nurse Practitioner

## 2023-05-08 DIAGNOSIS — M4696 Unspecified inflammatory spondylopathy, lumbar region: Secondary | ICD-10-CM | POA: Diagnosis not present

## 2023-05-08 DIAGNOSIS — M546 Pain in thoracic spine: Secondary | ICD-10-CM | POA: Diagnosis not present

## 2023-05-08 DIAGNOSIS — M542 Cervicalgia: Secondary | ICD-10-CM | POA: Diagnosis not present

## 2023-05-19 ENCOUNTER — Other Ambulatory Visit: Payer: Self-pay | Admitting: Nurse Practitioner

## 2023-05-19 NOTE — Telephone Encounter (Signed)
 Please review

## 2023-05-22 ENCOUNTER — Encounter: Payer: Self-pay | Admitting: Dermatology

## 2023-05-25 ENCOUNTER — Encounter: Payer: Self-pay | Admitting: Nurse Practitioner

## 2023-05-25 ENCOUNTER — Telehealth: Payer: Self-pay | Admitting: Nurse Practitioner

## 2023-05-25 NOTE — Telephone Encounter (Signed)
 FMLA paperwork completed. Faxed to [employer]; [phone]. Notified patient. Sent to be scanned-Toni

## 2023-06-08 ENCOUNTER — Other Ambulatory Visit: Payer: Self-pay | Admitting: Nurse Practitioner

## 2023-06-08 DIAGNOSIS — M5441 Lumbago with sciatica, right side: Secondary | ICD-10-CM

## 2023-06-09 MED ORDER — IBUPROFEN 800 MG PO TABS: 800.0000 mg | ORAL_TABLET | Freq: Three times a day (TID) | ORAL | 0 refills | Status: DC | PRN

## 2023-06-26 ENCOUNTER — Ambulatory Visit
Admission: RE | Admit: 2023-06-26 | Discharge: 2023-06-26 | Disposition: A | Discharge status: Home / Self Care | Payer: 59 | Source: Ambulatory Visit | Attending: Nurse Practitioner | Admitting: Nurse Practitioner

## 2023-06-26 DIAGNOSIS — Z1231 Encounter for screening mammogram for malignant neoplasm of breast: Secondary | ICD-10-CM | POA: Insufficient documentation

## 2023-07-03 ENCOUNTER — Encounter: Payer: Self-pay | Admitting: Dermatology

## 2023-07-03 ENCOUNTER — Ambulatory Visit: Payer: 59 | Admitting: Dermatology

## 2023-07-03 DIAGNOSIS — L818 Other specified disorders of pigmentation: Secondary | ICD-10-CM | POA: Diagnosis not present

## 2023-07-03 DIAGNOSIS — L410 Pityriasis lichenoides et varioliformis acuta: Secondary | ICD-10-CM

## 2023-07-03 MED ORDER — TRIAMCINOLONE ACETONIDE 0.1 % EX CREA: TOPICAL_CREAM | CUTANEOUS | 2 refills | Status: DC

## 2023-07-03 NOTE — Progress Notes (Signed)
   Follow Up Visit   Subjective  Stacy Curry is a 53 y.o. female who presents for the following: Rash. 2 month follow up. 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 Torso, arms, legs. Has not noticed any new lesions. Has 2-3 that will not resolved. Ones that have resolved have left discolored spots. Finished Doxycycline 100 mg ~1-2 weeks ago. Has noticed some improvement.    The following portions of the chart were reviewed this encounter and updated as appropriate: medications, allergies, medical history  Review of Systems:  No other skin or systemic complaints except as noted in HPI or Assessment and Plan.  Objective  Well appearing patient in no apparent distress; mood and affect are within normal limits.  A focused examination was performed of the following areas: Torso, arms, legs  Relevant exam findings are noted in the Assessment and Plan.    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 R thigh hypopigmented macule  Chronic and persistent condition with duration or expected duration over one year. Condition is improving with treatment but not currently at goal.   Treatment Plan: Start Triamcinolone 0.1% cream apply bid to spots until flat. Avoid applying to face, groin, and axilla. Use as directed. Long-term use can cause thinning of the skin.  Topical steroids (such as triamcinolone, fluocinolone, fluocinonide, mometasone, clobetasol, halobetasol, betamethasone, hydrocortisone) can cause thinning and lightening of the skin if they are used for too long in the same area. Your physician has selected the right strength medicine for your problem and area affected on the body. Please use your medication only as directed by your physician to prevent side effects.    Return in about 2 months (around  08/31/2023) for Rash Follow Up.  I, Lawson Radar, CMA, am acting as scribe for Elie Goody, MD.   Documentation: I have reviewed the above documentation for accuracy and completeness, and I agree with the above.  Elie Goody, MD

## 2023-07-03 NOTE — Patient Instructions (Addendum)
Start Triamcinolone 0.1% cream apply bid to spots until flat. Avoid applying to face, groin, and axilla. Use as directed. Long-term use can cause thinning of the skin.  Topical steroids (such as triamcinolone, fluocinolone, fluocinonide, mometasone, clobetasol, halobetasol, betamethasone, hydrocortisone) can cause thinning and lightening of the skin if they are used for too long in the same area. Your physician has selected the right strength medicine for your problem and area affected on the body. Please use your medication only as directed by your physician to prevent side effects.     Due to recent changes in healthcare laws, you may see results of your pathology and/or laboratory studies on MyChart before the doctors have had a chance to review them. We understand that in some cases there may be results that are confusing or concerning to you. Please understand that not all results are received at the same time and often the doctors may need to interpret multiple results in order to provide you with the best plan of care or course of treatment. Therefore, we ask that you please give [place] 2 business days to thoroughly review all your results before contacting the office for clarification. Should we see a critical lab result, you will be contacted sooner.   If You Need Anything After Your Visit  If you have any questions or concerns for your doctor, please call our main line at [phone] and press option 4 to reach your doctor's medical assistant. If no one answers, please leave a voicemail as directed and we will return your call as soon as possible. Messages left after 4 pm will be answered the following business day.   You may also send [place] a message via MyChart. We typically respond to MyChart messages within 1-2 business days.  For prescription refills, please ask your pharmacy to contact our office. Our fax number is [phone].  If you have an urgent issue when the clinic is closed that  cannot wait until the next business day, you can page your doctor at the number below.    Please note that while we do our best to be available for urgent issues outside of office hours, we are not available 24/7.   If you have an urgent issue and are unable to reach [place], you may choose to seek medical care at your doctor's office, retail clinic, urgent care center, or emergency room.  If you have a medical emergency, please immediately call 911 or go to the emergency department.  Pager Numbers  - Dr. Gwen Pounds: [phone]  - Dr. Roseanne Reno: [phone]  - Dr. Katrinka Blazing: [phone]   In the event of inclement weather, please call our main line at [phone] for an update on the status of any delays or closures.  Dermatology Medication Tips: Please keep the boxes that topical medications come in in order to help keep track of the instructions about where and how to use these. Pharmacies typically print the medication instructions only on the boxes and not directly on the medication tubes.   If your medication is too expensive, please contact our office at [phone] option 4 or send [place] a message through MyChart.   We are unable to tell what your co-pay for medications will be in advance as this is different depending on your insurance coverage. However, we may be able to find a substitute medication at lower cost or fill out paperwork to get insurance to cover a needed medication.   If a prior authorization is required to get your  medication covered by your insurance company, please allow [place] 1-2 business days to complete this process.  Drug prices often vary depending on where the prescription is filled and some pharmacies may offer cheaper prices.  The website www.goodrx.com contains coupons for medications through different pharmacies. The prices here do not account for what the cost may be with help from insurance (it may be cheaper with your insurance), but the website can give you  the price if you did not use any insurance.  - You can print the associated coupon and take it with your prescription to the pharmacy.  - You may also stop by our office during regular business hours and pick up a GoodRx coupon card.  - If you need your prescription sent electronically to a different pharmacy, notify our office through [facility] or by phone at [phone] option 4.     Si Usted Necesita Algo Despus de Su Visita  Tambin puede enviarnos un mensaje a travs de Clinical cytogeneticist. Por lo general respondemos a los mensajes de MyChart en el transcurso de 1 a 2 das hbiles.  Para renovar recetas, por favor pida a su farmacia que se ponga en contacto con nuestra oficina. Annie Sable de fax es [place] [phone].  Si tiene un asunto urgente cuando la clnica est cerrada y que no puede esperar hasta el siguiente da hbil, puede llamar/localizar a su doctor(a) al nmero que aparece a continuacin.   Por favor, tenga en cuenta que aunque hacemos todo lo posible para estar disponibles para asuntos urgentes fuera del horario de [place], no estamos disponibles las 24 horas del da, los 7 [address]  [address] de la [place].   Si tiene un problema urgente y no puede comunicarse con nosotros, puede optar por buscar atencin mdica  en el consultorio de su doctor(a), en una clnica privada, en un centro de atencin urgente o en una sala de emergencias.  Si tiene Engineer, drilling, por favor llame inmediatamente al 911 o vaya a la sala de emergencias.  Nmeros de bper  - Dr. Gwen Pounds: [phone]  - Dra. Roseanne Reno: [phone]  - Dr. Katrinka Blazing: [phone]   En caso de inclemencias del tiempo, por favor llame a Lacy Duverney principal al [phone] para una actualizacin sobre el [place] de cualquier retraso o cierre.  Consejos para la medicacin en dermatologa: Por favor, guarde las cajas en las que vienen los medicamentos de uso tpico para ayudarle a seguir las instrucciones sobre dnde y  cmo usarlos. Las farmacias generalmente imprimen las instrucciones del medicamento slo en las cajas y no directamente en los tubos del [place].   Si su medicamento es muy caro, por favor, pngase en contacto con Rolm Gala llamando al [phone] y presione la opcin 4 o envenos un mensaje a travs de Clinical cytogeneticist.   No podemos decirle cul ser su copago por los medicamentos por adelantado ya que esto es diferente dependiendo de la cobertura de su seguro. Sin embargo, es posible que podamos encontrar un medicamento sustituto a Audiological scientist un formulario para que el seguro cubra el medicamento que se considera necesario.   Si se requiere una autorizacin previa para que su compaa de seguros [place] su medicamento, por favor permtanos de 1 a 2 das hbiles para completar [address]  Los precios de los medicamentos varan con frecuencia dependiendo del Environmental consultant de dnde se surte la receta y alguna farmacias pueden ofrecer precios ms baratos.  El sitio web www.goodrx.com tiene cupones para medicamentos de Health and safety inspector. Los  precios aqu no tienen en cuenta lo que podra costar con la ayuda del seguro (puede ser ms barato con su seguro), pero el sitio web puede darle el precio si no utiliz Tourist information centre manager.  - Puede imprimir el cupn correspondiente y llevarlo con su receta a la farmacia.  - Tambin puede pasar por nuestra oficina durante el horario de atencin regular y Education officer, museum una tarjeta de cupones de GoodRx.  - Si necesita que su receta se enve electrnicamente a una farmacia diferente, informe a nuestra oficina a travs de MyChart de [place] o por telfono llamando al [phone] y presione la opcin 4.

## 2023-07-14 ENCOUNTER — Encounter: Payer: Self-pay | Admitting: Physician Assistant

## 2023-07-14 ENCOUNTER — Telehealth: Payer: 59 | Admitting: Physician Assistant

## 2023-07-14 ENCOUNTER — Encounter: Payer: Self-pay | Admitting: Nurse Practitioner

## 2023-07-14 VITALS — Resp 16 | Ht 63.5 in

## 2023-07-14 DIAGNOSIS — R197 Diarrhea, unspecified: Secondary | ICD-10-CM | POA: Diagnosis not present

## 2023-07-14 DIAGNOSIS — R112 Nausea with vomiting, unspecified: Secondary | ICD-10-CM | POA: Diagnosis not present

## 2023-07-14 DIAGNOSIS — K529 Noninfective gastroenteritis and colitis, unspecified: Secondary | ICD-10-CM | POA: Diagnosis not present

## 2023-07-14 MED ORDER — ONDANSETRON HCL 4 MG PO TABS: 4.0000 mg | ORAL_TABLET | Freq: Three times a day (TID) | ORAL | 0 refills | Status: DC | PRN

## 2023-07-14 NOTE — Telephone Encounter (Signed)
 Spoke with pt and made her video appt

## 2023-07-14 NOTE — Progress Notes (Signed)
 [facility] [address] [place], [place] [address]  Internal MEDICINE  Telephone Visit  Patient Name: Stacy Curry  [id number]  [id number]  Date of Service: 07/14/2023  I connected with the patient at 11:52 by telephone and verified the patients identity using two identifiers.   I discussed the limitations, risks, security and privacy concerns of performing an evaluation and management service by telephone and the availability of in person appointments. I also discussed with the patient that there may be a patient responsible charge related to the service.  The patient expressed understanding and agrees to proceed.    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 Felt nauseous with this, but did not vomit after eating last night -Drinking water and gingerale, may try some gatorade/pedialyte to help replenish electrolytes  Current Medication: Outpatient Encounter Medications as of 07/14/2023  Medication Sig   Accu-Chek Softclix Lancets lancets Use as instructed   amLODipine (NORVASC) 5 MG tablet Take 1 tablet (5 mg total) by mouth daily.   cyanocobalamin (VITAMIN B12) 1000 MCG/ML injection Inject 1 mL (1,000 mcg total) into the muscle every 30 (thirty) days.   cyclobenzaprine (FLEXERIL) 10 MG tablet Take 0.5-1 tablets (5-10 mg total) by mouth at bedtime as needed (musculoskeletal pain).   doxycycline (MONODOX) 100 MG capsule Take 1 capsule (100 mg  total) by mouth 2 (two) times daily. Take with food   DULoxetine (CYMBALTA) 20 MG capsule Take 1 capsule (20 mg total) by mouth daily.   glucose blood test strip Use as instructed   ibuprofen (ADVIL) 800 MG tablet Take 1 tablet (800 mg total) by mouth every 8 (eight) hours as needed for moderate pain (pain score 4-6).   losartan-hydrochlorothiazide (HYZAAR) 50-12.5 MG tablet Take 1 tablet by mouth daily.   methocarbamol (ROBAXIN) 500 MG tablet Take 1 tablet (500 mg total) by mouth every 8 (eight) hours as needed for muscle spasms.   Multiple Vitamin (MULTIVITAMIN) tablet Take 1 tablet by mouth daily.   ondansetron (ZOFRAN) 4 MG tablet Take 1 tablet (4 mg total) by mouth every 8 (eight) hours as needed for nausea or vomiting.   SYRINGE-NEEDLE, DISP, 3 ML (B-D 3CC LUER-LOK SYR 21GX1") 21G X 1" 3 ML MISC Use 1 syringe and needle to inject B12 every 30 days   triamcinolone cream (KENALOG) 0.1 % Apply bid to spots until flat. Avoid applying to face, groin, and axilla.   Vitamin D, Ergocalciferol, (DRISDOL) 1.25 MG (50000 UNIT) CAPS capsule Take 1 capsule by mouth once a week   Facility-Administered Encounter Medications as of 07/14/2023  Medication   triamcinolone acetonide (KENALOG-40) injection 40 mg    Surgical History: Past Surgical History:  Procedure Laterality Date   TUBAL LIGATION      Medical History: Past Medical History:  Diagnosis Date   Allergy    Chicken pox age 10   Diabetes mellitus without complication (HCC)    Hypertension  Family History: Family History  Problem Relation Age of Onset   Cancer Mother    Diabetes Father    Heart attack Maternal Grandmother    Breast cancer Neg Hx     Social History   Socioeconomic History   Marital status: Single    Spouse name: Not on file   Number of children: Not on file   Years of education: Not on file   Highest education level: Not on file  Occupational History   Not on file  Tobacco Use   Smoking status: Some  Days    Current packs/day: 0.00    Types: Cigarettes, E-cigarettes    Last attempt to quit: 05/09/2021    Years since quitting: 2.1   Smokeless tobacco: Never  Vaping Use   Vaping status: Never Used  Substance and Sexual Activity   Alcohol use: Yes    Comment: 1-2 per month (wine or mixed)   Drug use: Never   Sexual activity: Yes    Partners: Male  Other Topics Concern   Not on file  Social History Narrative   Not on file   Social Drivers of Health   Financial Resource Strain: Not on file  Food Insecurity: Not on file  Transportation Needs: Not on file  Physical Activity: Not on file  Stress: Not on file  Social Connections: Not on file  Intimate Partner Violence: Not on file      Review of Systems  Constitutional:  Negative for fever.  HENT:  Negative for congestion, mouth sores and postnasal drip.   Respiratory:  Negative for cough.   Cardiovascular:  Negative for chest pain.  Gastrointestinal:  Positive for diarrhea, nausea and vomiting.  Genitourinary:  Negative for flank pain.  Psychiatric/Behavioral: Negative.      Vital Signs: Resp 16   Ht 5' 3.5" (1.613 m)   LMP 10/17/2017   BMI 34.18 kg/m    Observation/Objective:  Pt is able to carry out conversation   Assessment/Plan: 1. Nausea vomiting and diarrhea (Primary) Improving, likely secondary to viral GI illness. Will send zofran to help nausea and advised on staying well hydrated - ondansetron (ZOFRAN) 4 MG tablet; Take 1 tablet (4 mg total) by mouth every 8 (eight) hours as needed for nausea or vomiting.  Dispense: 20 tablet; Refill: 0  2. Gastroenteritis Likely viral GI illness, improving now. Will treat nausea and advised on staying well hydrated   General Counseling: Stacy Curry verbalizes understanding of the findings of today's phone visit and agrees with plan of treatment. I have discussed any further diagnostic evaluation that may be needed or ordered today. We also reviewed her medications  today. she has been encouraged to call the office with any questions or concerns that should arise related to todays visit.    No orders of the defined types were placed in this encounter.   Meds ordered this encounter  Medications   ondansetron (ZOFRAN) 4 MG tablet    Sig: Take 1 tablet (4 mg total) by mouth every 8 (eight) hours as needed for nausea or vomiting.    Dispense:  20 tablet    Refill:  0    Time spent:25 Minutes    Dr Lyndon Code Internal medicine

## 2023-07-31 ENCOUNTER — Other Ambulatory Visit: Payer: Self-pay | Admitting: Internal Medicine

## 2023-07-31 DIAGNOSIS — E559 Vitamin D deficiency, unspecified: Secondary | ICD-10-CM

## 2023-08-03 ENCOUNTER — Other Ambulatory Visit: Payer: Self-pay

## 2023-08-03 ENCOUNTER — Encounter: Payer: Self-pay | Admitting: Dermatology

## 2023-08-03 MED ORDER — TRIAMCINOLONE ACETONIDE 0.1 % EX CREA: TOPICAL_CREAM | CUTANEOUS | 1 refills | Status: DC

## 2023-09-11 ENCOUNTER — Ambulatory Visit: Payer: 59 | Admitting: Dermatology

## 2023-09-11 ENCOUNTER — Ambulatory Visit: Payer: 59 | Admitting: Nurse Practitioner

## 2023-10-23 ENCOUNTER — Encounter: Payer: Self-pay | Admitting: Nurse Practitioner

## 2023-10-30 ENCOUNTER — Encounter: Payer: Self-pay | Admitting: Nurse Practitioner

## 2023-10-30 ENCOUNTER — Ambulatory Visit: Payer: Self-pay | Admitting: Nurse Practitioner

## 2023-10-30 VITALS — BP 132/70 | HR 79 | Temp 98.1°F | Resp 16 | Ht 63.5 in | Wt 192.0 lb

## 2023-10-30 DIAGNOSIS — E1159 Type 2 diabetes mellitus with other circulatory complications: Secondary | ICD-10-CM

## 2023-10-30 DIAGNOSIS — M47817 Spondylosis without myelopathy or radiculopathy, lumbosacral region: Secondary | ICD-10-CM

## 2023-10-30 DIAGNOSIS — G8929 Other chronic pain: Secondary | ICD-10-CM

## 2023-10-30 DIAGNOSIS — M5441 Lumbago with sciatica, right side: Secondary | ICD-10-CM

## 2023-10-30 DIAGNOSIS — M160 Bilateral primary osteoarthritis of hip: Secondary | ICD-10-CM

## 2023-10-30 DIAGNOSIS — M5442 Lumbago with sciatica, left side: Secondary | ICD-10-CM

## 2023-10-30 DIAGNOSIS — I152 Hypertension secondary to endocrine disorders: Secondary | ICD-10-CM

## 2023-10-30 MED ORDER — LOSARTAN POTASSIUM-HCTZ 50-12.5 MG PO TABS: 1.0000 | ORAL_TABLET | Freq: Every day | ORAL | 1 refills | Status: DC

## 2023-10-30 MED ORDER — CYCLOBENZAPRINE HCL 10 MG PO TABS: 5.0000 mg | ORAL_TABLET | Freq: Every evening | ORAL | 1 refills | Status: DC | PRN

## 2023-10-30 MED ORDER — METHOCARBAMOL 500 MG PO TABS: 500.0000 mg | ORAL_TABLET | Freq: Four times a day (QID) | ORAL | 5 refills | Status: AC | PRN

## 2023-10-30 MED ORDER — AMLODIPINE BESYLATE 5 MG PO TABS: 5.0000 mg | ORAL_TABLET | Freq: Every day | ORAL | 1 refills | Status: DC

## 2023-10-30 NOTE — Progress Notes (Signed)
 [facility] [address] [place], [place] [address]  Internal MEDICINE  Office Visit Note  Patient Name: Stacy Curry  [id number]  [id number]  Date of Service: 10/30/2023  Chief Complaint  Patient presents with   Diabetes   Hypertension   Follow-up    FMLA paperwork    HPI Tashianna presents for a follow-up visit for chronic low back pain, hypertension, hip arthritis and diabetes.  Chronic low back pain -- xray of lumbar spine showed mild degenerative changes. Last year. Has FMLA paperwork to renew her intermittent leave status.  Hypertension -- controlled with amlodipine  and losartan -hydrochlorothiazide  Hip arthritis -- taking methocarbamol  as needed during the day, and takes cyclobenzaprine  at bedtime.  Type 2 diabetes -- stable and controlled.     Current Medication: Outpatient Encounter Medications as of 10/30/2023  Medication Sig   Accu-Chek Softclix Lancets lancets Use as instructed   amLODipine  (NORVASC ) 5 MG tablet Take 1 tablet (5 mg total) by mouth daily.   cyanocobalamin  (VITAMIN B12) 1000 MCG/ML injection Inject 1 mL (1,000 mcg total) into the muscle every 30 (thirty) days.   cyclobenzaprine  (FLEXERIL ) 10 MG tablet Take 0.5-1 tablets (5-10 mg total) by mouth at bedtime as needed (musculoskeletal pain).   doxycycline  (MONODOX ) 100 MG capsule Take 1 capsule (100 mg total) by mouth 2 (two) times daily. Take with food   glucose blood test strip Use as instructed   ibuprofen  (ADVIL ) 800 MG tablet Take 1 tablet (800 mg total) by mouth every 8 (eight) hours as needed for moderate pain (pain score 4-6).   losartan -hydrochlorothiazide  (HYZAAR) 50-12.5 MG tablet Take 1 tablet by mouth daily.   methocarbamol  (ROBAXIN ) 500 MG tablet Take 1-2 tablets (500-1,000 mg total) by mouth every 6 (six) hours as needed for muscle spasms.   Multiple Vitamin (MULTIVITAMIN) tablet Take 1 tablet by mouth daily.   ondansetron  (ZOFRAN ) 4 MG tablet Take 1 tablet (4 mg total) by mouth  every 8 (eight) hours as needed for nausea or vomiting.   SYRINGE-NEEDLE, DISP, 3 ML (B-D 3CC LUER-LOK SYR 21GX1) 21G X 1 3 ML MISC Use 1 syringe and needle to inject B12 every 30 days   triamcinolone  cream (KENALOG ) 0.1 % Apply bid to spots until flat. Avoid applying to face, groin, and axilla.   Vitamin D , Ergocalciferol , (DRISDOL ) 1.25 MG (50000 UNIT) CAPS capsule Take 1 capsule by mouth once a week   [DISCONTINUED] amLODipine  (NORVASC ) 5 MG tablet Take 1 tablet (5 mg total) by mouth daily.   [DISCONTINUED] cyclobenzaprine  (FLEXERIL ) 10 MG tablet Take 0.5-1 tablets (5-10 mg total) by mouth at bedtime as needed (musculoskeletal pain).   [DISCONTINUED] DULoxetine  (CYMBALTA ) 20 MG capsule Take 1 capsule (20 mg total) by mouth daily.   [DISCONTINUED] losartan -hydrochlorothiazide  (HYZAAR) 50-12.5 MG tablet Take 1 tablet by mouth daily.   [DISCONTINUED] methocarbamol  (ROBAXIN ) 500 MG tablet Take 1 tablet (500 mg total) by mouth every 8 (eight) hours as needed for muscle spasms.   Facility-Administered Encounter Medications as of 10/30/2023  Medication   triamcinolone  acetonide (KENALOG -40) injection 40 mg    Surgical History: Past Surgical History:  Procedure Laterality Date   TUBAL LIGATION      Medical History: Past Medical History:  Diagnosis Date   Allergy    Chicken pox age 68   Diabetes mellitus without complication (HCC)    Hypertension     Family History: Family History  Problem Relation Age of Onset   Cancer Mother    Diabetes Father  Heart attack Maternal Grandmother    Breast cancer Neg Hx     Social History   Socioeconomic History   Marital status: Single    Spouse name: Not on file   Number of children: Not on file   Years of education: Not on file   Highest education level: Not on file  Occupational History   Not on file  Tobacco Use   Smoking status: Some Days    Current packs/day: 0.00    Types: Cigarettes, E-cigarettes    Last attempt to quit:  05/09/2021    Years since quitting: 2.5   Smokeless tobacco: Never  Vaping Use   Vaping status: Never Used  Substance and Sexual Activity   Alcohol use: Yes    Comment: 1-2 per month (wine or mixed)   Drug use: Never   Sexual activity: Yes    Partners: Male  Other Topics Concern   Not on file  Social History Narrative   Not on file   Social Drivers of Health   Financial Resource Strain: Not on file  Food Insecurity: Not on file  Transportation Needs: Not on file  Physical Activity: Not on file  Stress: Not on file  Social Connections: Not on file  Intimate Partner Violence: Not on file      Review of Systems  Constitutional:  Negative for fever.  HENT:  Negative for congestion, mouth sores and postnasal drip.   Respiratory:  Negative for cough.   Cardiovascular:  Negative for chest pain.  Gastrointestinal:  Positive for diarrhea, nausea and vomiting.  Genitourinary:  Negative for flank pain.  Psychiatric/Behavioral: Negative.      Vital Signs: BP 132/70 Comment: 150/90  Pulse 79   Temp 98.1 F (36.7 C)   Resp 16   Ht 5' 3.5 (1.613 m)   Wt 192 lb (87.1 kg)   LMP 10/17/2017   SpO2 98%   BMI 33.48 kg/m    Physical Exam Vitals reviewed.  Constitutional:      Appearance: Normal appearance.  HENT:     Head: Normocephalic and atraumatic.     Nose: Nose normal.     Mouth/Throat:     Mouth: Mucous membranes are moist.     Pharynx: No posterior oropharyngeal erythema.  Eyes:     Extraocular Movements: Extraocular movements intact.     Pupils: Pupils are equal, round, and reactive to light.  Cardiovascular:     Pulses: Normal pulses.     Heart sounds: Normal heart sounds.  Pulmonary:     Effort: Pulmonary effort is normal.     Breath sounds: Normal breath sounds.  Musculoskeletal:        General: Tenderness present.     Comments: Upper back and lower back   Neurological:     General: No focal deficit present.     Mental Status: She is alert.      Gait: Gait abnormal.  Psychiatric:        Mood and Affect: Mood normal.        Behavior: Behavior normal.        Assessment/Plan: 1. Chronic bilateral low back pain with bilateral sciatica (Primary) Continue methocarbamol  and cyclobenzaprine  as needed  - cyclobenzaprine  (FLEXERIL ) 10 MG tablet; Take 0.5-1 tablets (5-10 mg total) by mouth at bedtime as needed (musculoskeletal pain).  Dispense: 30 tablet; Refill: 1 - methocarbamol  (ROBAXIN ) 500 MG tablet; Take 1-2 tablets (500-1,000 mg total) by mouth every 6 (six) hours as needed for muscle spasms.  Dispense: 120 tablet; Refill: 5  2. Arthritis of both hips Continue methocarbamol  and cyclobenzaprine  as needed  - cyclobenzaprine  (FLEXERIL ) 10 MG tablet; Take 0.5-1 tablets (5-10 mg total) by mouth at bedtime as needed (musculoskeletal pain).  Dispense: 30 tablet; Refill: 1 - methocarbamol  (ROBAXIN ) 500 MG tablet; Take 1-2 tablets (500-1,000 mg total) by mouth every 6 (six) hours as needed for muscle spasms.  Dispense: 120 tablet; Refill: 5  3. Spondylosis of lumbosacral region without myelopathy or radiculopathy Continue methocarbamol  and cyclobenzaprine  as needed  - cyclobenzaprine  (FLEXERIL ) 10 MG tablet; Take 0.5-1 tablets (5-10 mg total) by mouth at bedtime as needed (musculoskeletal pain).  Dispense: 30 tablet; Refill: 1 - methocarbamol  (ROBAXIN ) 500 MG tablet; Take 1-2 tablets (500-1,000 mg total) by mouth every 6 (six) hours as needed for muscle spasms.  Dispense: 120 tablet; Refill: 5  4. Type 2 diabetes mellitus with other circulatory complication, without long-term current use of insulin (HCC) Repeat A1c at next office visit.   5. Hypertension associated with type 2 diabetes mellitus (HCC) Stable, continue amlodipine  and losartan -hydrochlorothiazide  as prescribed.  - amLODipine  (NORVASC ) 5 MG tablet; Take 1 tablet (5 mg total) by mouth daily.  Dispense: 90 tablet; Refill: 1 - losartan -hydrochlorothiazide  (HYZAAR) 50-12.5 MG  tablet; Take 1 tablet by mouth daily.  Dispense: 90 tablet; Refill: 1   General Counseling: Albena verbalizes understanding of the findings of todays visit and agrees with plan of treatment. I have discussed any further diagnostic evaluation that may be needed or ordered today. We also reviewed her medications today. she has been encouraged to call the office with any questions or concerns that should arise related to todays visit.    No orders of the defined types were placed in this encounter.   Meds ordered this encounter  Medications   amLODipine  (NORVASC ) 5 MG tablet    Sig: Take 1 tablet (5 mg total) by mouth daily.    Dispense:  90 tablet    Refill:  1   cyclobenzaprine  (FLEXERIL ) 10 MG tablet    Sig: Take 0.5-1 tablets (5-10 mg total) by mouth at bedtime as needed (musculoskeletal pain).    Dispense:  30 tablet    Refill:  1   losartan -hydrochlorothiazide  (HYZAAR) 50-12.5 MG tablet    Sig: Take 1 tablet by mouth daily.    Dispense:  90 tablet    Refill:  1   methocarbamol  (ROBAXIN ) 500 MG tablet    Sig: Take 1-2 tablets (500-1,000 mg total) by mouth every 6 (six) hours as needed for muscle spasms.    Dispense:  120 tablet    Refill:  5    Fill new script today    Return in about 3 months (around 01/30/2024) for F/U, Recheck A1C, Jeanean Hollett PCP.   Total time spent:30 Minutes Time spent includes review of chart, medications, test results, and follow up plan with the patient.    Controlled Substance Database was reviewed by me.  This patient was seen by Mardy Maxin, FNP-C in collaboration with Dr. Sigrid Bathe as a part of collaborative care agreement.   Mishaal Lansdale R. Maxin, MSN, FNP-C Internal medicine

## 2023-10-31 ENCOUNTER — Telehealth: Payer: Self-pay | Admitting: Nurse Practitioner

## 2023-10-31 NOTE — Telephone Encounter (Signed)
 FMLA paperwork completed, Faxed to [employer]; [phone]. Scanned. Patient to p/u @ front desk-Ton

## 2023-11-06 ENCOUNTER — Encounter: Payer: Self-pay | Admitting: Dermatology

## 2023-11-13 ENCOUNTER — Encounter: Payer: Self-pay | Admitting: Dermatology

## 2023-11-13 ENCOUNTER — Ambulatory Visit (INDEPENDENT_AMBULATORY_CARE_PROVIDER_SITE_OTHER): Payer: Self-pay | Admitting: Dermatology

## 2023-11-13 DIAGNOSIS — L410 Pityriasis lichenoides et varioliformis acuta: Secondary | ICD-10-CM

## 2023-11-13 DIAGNOSIS — L818 Other specified disorders of pigmentation: Secondary | ICD-10-CM

## 2023-11-13 MED ORDER — AZITHROMYCIN 250 MG PO TABS: ORAL_TABLET | ORAL | 0 refills | Status: DC

## 2023-11-13 NOTE — Patient Instructions (Signed)

## 2023-11-13 NOTE — Progress Notes (Signed)
   Follow-Up Visit   Subjective  Stacy Curry is a 53 y.o. female who presents for the following: recurrent spots x1-2 weeks, patient has been applying TMC 0.1% cream, spots only itchy when fresh out of the shower and at night.   Patient is self-pay.  The patient has spots, moles and lesions to be evaluated, some may be new or changing and the patient may have concern these could be cancer.   The following portions of the chart were reviewed this encounter and updated as appropriate: medications, allergies, medical history  Review of Systems:  No other skin or systemic complaints except as noted in HPI or Assessment and Plan.  Objective  Well appearing patient in no apparent distress; mood and affect are within normal limits.  A focused examination was performed of the following areas: Torso, arms, legs, palms, feet, mouth  Relevant exam findings are noted in the Assessment and Plan.    Assessment & Plan   PLEVA with postinflammatory pigmentary changes, failed Doxycycline , recurrent. Lab work negative for underlying cause Exam: circular erythematous to hyperpigmented scaly papules on trunk arms legs. No lesions on palms soles   Chronic and persistent condition with duration or expected duration over one year. Condition is bothersome/symptomatic for patient. Currently flared.    Treatment Plan: Start Azithromycin  Take 500 mg on day one then 250 mg per day for days 2 to 5.   Reviewed potential side effects including headaches, GI upset, prolonged QT, or rash. Patient advised to let us  know if having any side effects.    continue to use TMC 0.1% cream apply bid to spots until flat. Avoid applying to face, groin, and axilla. Use as directed. Long-term use can cause thinning of the skin.   Topical steroids (such as triamcinolone , fluocinolone, fluocinonide, mometasone, clobetasol, halobetasol, betamethasone, hydrocortisone) can cause thinning and lightening of the skin if they are  used for too long in the same area. Your physician has selected the right strength medicine for your problem and area affected on the body. Please use your medication only as directed by your physician to prevent side effects.    Patient will send update in 2 weeks via MyChart.    Return in about 3 months (around 02/13/2024) for w/ Dr. Claudene.  I, Jacquelynn V. Wilfred, CMA, am acting as scribe for Boneta Claudene, MD .   Documentation: I have reviewed the above documentation for accuracy and completeness, and I agree with the above.  Boneta Claudene, MD

## 2023-12-05 ENCOUNTER — Encounter: Payer: Self-pay | Admitting: Nurse Practitioner

## 2023-12-31 ENCOUNTER — Encounter: Payer: Self-pay | Admitting: Dermatology

## 2024-01-07 ENCOUNTER — Encounter: Payer: Self-pay | Admitting: Nurse Practitioner

## 2024-01-16 ENCOUNTER — Ambulatory Visit: Admitting: Dermatology

## 2024-02-12 ENCOUNTER — Ambulatory Visit: Admitting: Dermatology

## 2024-02-20 ENCOUNTER — Ambulatory Visit: Payer: Self-pay | Admitting: Nurse Practitioner

## 2024-02-20 ENCOUNTER — Encounter: Payer: Self-pay | Admitting: Nurse Practitioner

## 2024-02-20 VITALS — BP 135/75 | HR 90 | Temp 96.5°F | Resp 16 | Ht 63.5 in | Wt 189.4 lb

## 2024-02-20 DIAGNOSIS — J069 Acute upper respiratory infection, unspecified: Secondary | ICD-10-CM

## 2024-02-20 DIAGNOSIS — U071 COVID-19: Secondary | ICD-10-CM

## 2024-02-20 MED ORDER — HYDROCOD POLI-CHLORPHE POLI ER 10-8 MG/5ML PO SUER: 5.0000 mL | Freq: Two times a day (BID) | ORAL | 0 refills | Status: DC | PRN

## 2024-02-20 NOTE — Telephone Encounter (Signed)
Spoke with pt and made her appt today  

## 2024-02-20 NOTE — Progress Notes (Addendum)
 Stacy Curry [address] [place], [place] [address]  Internal MEDICINE  Office Visit Note  Patient Name: Stacy Curry  [id number]  [id number]  Date of Service: 02/20/2024  Chief Complaint  Patient presents with   Acute Visit    Coughing, sinusitis covid and flu negative     HPI Stacy Curry presents for an acute sick visit for URI due to covid --onset of symptoms was yesterday Positive for covid yesterday.  --reports headache, cough, fever, chills, fatigue, body aches, nasal congestion, runny nose, sinus drainage and sore throat.      Current Medication:  Outpatient Encounter Medications as of 02/20/2024  Medication Sig   chlorpheniramine-HYDROcodone  (TUSSIONEX) 10-8 MG/5ML Take 5 mLs by mouth every 12 (twelve) hours as needed for cough.   Accu-Chek Softclix Lancets lancets Use as instructed   amLODipine  (NORVASC ) 5 MG tablet Take 1 tablet (5 mg total) by mouth daily.   azithromycin  (ZITHROMAX ) 250 MG tablet Take 500 mg on day one then 250 mg per day for days 2 to 5   cyanocobalamin  (VITAMIN B12) 1000 MCG/ML injection Inject 1 mL (1,000 mcg total) into the muscle every 30 (thirty) days.   cyclobenzaprine  (FLEXERIL ) 10 MG tablet Take 0.5-1 tablets (5-10 mg total) by mouth at bedtime as needed (musculoskeletal pain).   doxycycline  (MONODOX ) 100 MG capsule Take 1 capsule (100 mg total) by mouth 2 (two) times daily. Take with food   glucose blood test strip Use as instructed   ibuprofen  (ADVIL ) 800 MG tablet Take 1 tablet (800 mg total) by mouth every 8 (eight) hours as needed for moderate pain (pain score 4-6).   losartan -hydrochlorothiazide  (HYZAAR) 50-12.5 MG tablet Take 1 tablet by mouth daily.   methocarbamol  (ROBAXIN ) 500 MG tablet Take 1-2 tablets (500-1,000 mg total) by mouth every 6 (six) hours as needed for muscle spasms.   Multiple Vitamin (MULTIVITAMIN) tablet Take 1 tablet by mouth daily.   ondansetron  (ZOFRAN ) 4 MG tablet Take 1 tablet (4 mg total) by mouth  every 8 (eight) hours as needed for nausea or vomiting.   SYRINGE-NEEDLE, DISP, 3 ML (B-D 3CC LUER-LOK SYR 21GX1) 21G X 1 3 ML MISC Use 1 syringe and needle to inject B12 every 30 days   triamcinolone  cream (KENALOG ) 0.1 % Apply bid to spots until flat. Avoid applying to face, groin, and axilla.   Vitamin D , Ergocalciferol , (DRISDOL ) 1.25 MG (50000 UNIT) CAPS capsule Take 1 capsule by mouth once a week   Facility-Administered Encounter Medications as of 02/20/2024  Medication   triamcinolone  acetonide (KENALOG -40) injection 40 mg      Medical History: Past Medical History:  Diagnosis Date   Allergy    Chicken pox age 50   Diabetes mellitus without complication (HCC)    Hypertension      Vital Signs: BP 135/75 Comment: 150/82  Pulse 90   Temp (!) 96.5 F (35.8 C)   Resp 16   Ht 5' 3.5 (1.613 m)   Wt 189 lb 6.4 oz (85.9 kg)   LMP 10/17/2017   SpO2 98%   BMI 33.02 kg/m    Review of Systems  Constitutional:  Positive for chills, fatigue and fever.  HENT:  Positive for congestion, postnasal drip, rhinorrhea, sinus pressure, sinus pain and sore throat.   Respiratory:  Positive for cough. Negative for chest tightness, shortness of breath and wheezing.   Cardiovascular: Negative.  Negative for chest pain and palpitations.  Gastrointestinal: Negative.   Musculoskeletal:  Positive for arthralgias and myalgias.  Neurological:  Positive for headaches. Negative for dizziness and light-headedness.  Psychiatric/Behavioral:  Positive for sleep disturbance (due to cough).     Physical Exam Constitutional:      General: She is not in acute distress.    Appearance: Normal appearance. She is obese. She is ill-appearing.  HENT:     Head: Normocephalic and atraumatic.     Right Ear: Tympanic membrane, ear canal and external ear normal.     Left Ear: Tympanic membrane, ear canal and external ear normal.     Nose: Congestion and rhinorrhea present.     Mouth/Throat:     Mouth:  Mucous membranes are moist.     Pharynx: Posterior oropharyngeal erythema present.  Eyes:     Extraocular Movements: Extraocular movements intact.     Conjunctiva/sclera: Conjunctivae normal.     Pupils: Pupils are equal, round, and reactive to light.  Cardiovascular:     Rate and Rhythm: Normal rate and regular rhythm.     Pulses: Normal pulses.     Heart sounds: Normal heart sounds. No murmur heard. Pulmonary:     Effort: Pulmonary effort is normal. No respiratory distress.     Breath sounds: Normal breath sounds. No wheezing.  Abdominal:     General: Bowel sounds are normal. There is no distension.     Palpations: Abdomen is soft.     Tenderness: There is no abdominal tenderness. There is no guarding.  Lymphadenopathy:     Cervical: Cervical adenopathy present.  Skin:    General: Skin is warm and dry.     Capillary Refill: Capillary refill takes less than 2 seconds.  Neurological:     Mental Status: She is alert and oriented to person, place, and time.  Psychiatric:        Mood and Affect: Mood normal.        Behavior: Behavior normal.       Assessment/Plan: 1. Upper respiratory tract infection due to COVID-19 virus (Primary) Cough suppressant prescribed for symptom management as needed. Paxlovid not prescribed at this time. - chlorpheniramine-HYDROcodone  (TUSSIONEX) 10-8 MG/5ML; Take 5 mLs by mouth every 12 (twelve) hours as needed for cough.  Dispense: 140 mL; Refill: 0   General Counseling: [name] verbalizes understanding of the findings of todays visit and agrees with plan of treatment. I have discussed any further diagnostic evaluation that may be needed or ordered today. We also reviewed her medications today. she has been encouraged to call the office with any questions or concerns that should arise related to todays visit.    Counseling:    No orders of the defined types were placed in this encounter.   Meds ordered this encounter  Medications    chlorpheniramine-HYDROcodone  (TUSSIONEX) 10-8 MG/5ML    Sig: Take 5 mLs by mouth every 12 (twelve) hours as needed for cough.    Dispense:  140 mL    Refill:  0    Fill new script today    Return if symptoms worsen or fail to improve, for keep regular scheduled follow up .  [place] Controlled Substance Database was reviewed by me for overdose risk score (ORS)  Time spent:30 Minutes Time spent with patient included reviewing progress notes, labs, imaging studies, and discussing plan for follow up.   This patient was seen by Mardy Maxin, FNP-C in collaboration with Dr. Sigrid Bathe as a part of collaborative care agreement.  Kaelyn Nauta R. Maxin, MSN, FNP-C Internal Medicine

## 2024-03-24 ENCOUNTER — Encounter: Payer: Self-pay | Admitting: Nurse Practitioner

## 2024-03-28 ENCOUNTER — Encounter: Payer: Self-pay | Admitting: Nurse Practitioner

## 2024-03-28 ENCOUNTER — Other Ambulatory Visit
Admission: RE | Admit: 2024-03-28 | Disposition: A | Discharge status: Home / Self Care | Payer: Self-pay | Source: Ambulatory Visit | Attending: Nurse Practitioner | Admitting: Nurse Practitioner

## 2024-03-28 ENCOUNTER — Ambulatory Visit: Payer: Self-pay | Admitting: Nurse Practitioner

## 2024-03-28 VITALS — BP 138/88 | HR 88 | Temp 97.6°F | Resp 16 | Ht 63.5 in | Wt 190.6 lb

## 2024-03-28 DIAGNOSIS — M5441 Lumbago with sciatica, right side: Secondary | ICD-10-CM

## 2024-03-28 DIAGNOSIS — Z0289 Encounter for other administrative examinations: Secondary | ICD-10-CM

## 2024-03-28 DIAGNOSIS — Z111 Encounter for screening for respiratory tuberculosis: Secondary | ICD-10-CM

## 2024-03-28 DIAGNOSIS — G8929 Other chronic pain: Secondary | ICD-10-CM

## 2024-03-28 DIAGNOSIS — M5442 Lumbago with sciatica, left side: Secondary | ICD-10-CM

## 2024-03-28 MED ORDER — METHYLPREDNISOLONE ACETATE 80 MG/ML IJ SUSP
80.0000 mg | Freq: Once | INTRAMUSCULAR | Status: AC
  Administered 2024-03-28: 40 mg via INTRAMUSCULAR

## 2024-03-28 NOTE — Progress Notes (Signed)
 [facility] [address] [place], [place] [address]  Internal MEDICINE  Office Visit Note  Patient Name: Stacy Curry  [id number]  [id number]  Date of Service: 03/28/2024  Chief Complaint  Patient presents with   Diabetes   Hypertension   Follow-up    HPI Beverlyann presents for a follow-up visit for paperwork for social services.  requesting paperwork to be completed so that she can get custody of her 2 grandchildren since the mother is not taking care of the children.  She is required to have a a TB test for this application as well.  She does have chronic back pain and she takes intermittent FMLA for this. Her back pain is flared up right now and causing her increased pain with sitting and standing for any significant amount of time. She takes OTC pain medication as needed and intermittently takes prescribed muscle relaxants.     Current Medication: Outpatient Encounter Medications as of 03/28/2024  Medication Sig   methocarbamol  (ROBAXIN ) 500 MG tablet Take 1-2 tablets (500-1,000 mg total) by mouth every 6 (six) hours as needed for muscle spasms.   Vitamin D , Ergocalciferol , (DRISDOL ) 1.25 MG (50000 UNIT) CAPS capsule Take 1 capsule by mouth once a week   [DISCONTINUED] Accu-Chek Softclix Lancets lancets Use as instructed (Patient not taking: Reported on 04/19/2024)   [DISCONTINUED] amLODipine  (NORVASC ) 5 MG tablet Take 1 tablet (5 mg total) by mouth daily.   [DISCONTINUED] azithromycin  (ZITHROMAX ) 250 MG tablet Take 500 mg on day one then 250 mg per day for days 2 to 5 (Patient not taking: Reported on 03/28/2024)   [DISCONTINUED] chlorpheniramine-HYDROcodone  (TUSSIONEX) 10-8 MG/5ML Take 5 mLs by mouth every 12 (twelve) hours as needed for cough. (Patient not taking: Reported on 03/28/2024)   [DISCONTINUED] cyanocobalamin  (VITAMIN B12) 1000 MCG/ML injection Inject 1 mL (1,000 mcg total) into the muscle every 30 (thirty) days. (Patient not taking: Reported on 03/28/2024)    [DISCONTINUED] cyclobenzaprine  (FLEXERIL ) 10 MG tablet Take 0.5-1 tablets (5-10 mg total) by mouth at bedtime as needed (musculoskeletal pain). (Patient not taking: Reported on 04/19/2024)   [DISCONTINUED] doxycycline  (MONODOX ) 100 MG capsule Take 1 capsule (100 mg total) by mouth 2 (two) times daily. Take with food (Patient not taking: Reported on 03/28/2024)   [DISCONTINUED] glucose blood test strip Use as instructed (Patient not taking: Reported on 03/28/2024)   [DISCONTINUED] ibuprofen  (ADVIL ) 800 MG tablet Take 1 tablet (800 mg total) by mouth every 8 (eight) hours as needed for moderate pain (pain score 4-6).   [DISCONTINUED] losartan -hydrochlorothiazide  (HYZAAR) 50-12.5 MG tablet Take 1 tablet by mouth daily.   [DISCONTINUED] Multiple Vitamin (MULTIVITAMIN) tablet Take 1 tablet by mouth daily. (Patient not taking: Reported on 04/19/2024)   [DISCONTINUED] ondansetron  (ZOFRAN ) 4 MG tablet Take 1 tablet (4 mg total) by mouth every 8 (eight) hours as needed for nausea or vomiting. (Patient not taking: Reported on 03/28/2024)   [DISCONTINUED] SYRINGE-NEEDLE, DISP, 3 ML (B-D 3CC LUER-LOK SYR 21GX1) 21G X 1 3 ML MISC Use 1 syringe and needle to inject B12 every 30 days (Patient not taking: Reported on 03/28/2024)   [DISCONTINUED] triamcinolone  cream (KENALOG ) 0.1 % Apply bid to spots until flat. Avoid applying to face, groin, and axilla. (Patient not taking: Reported on 04/19/2024)   [EXPIRED] methylPREDNISolone  acetate (DEPO-MEDROL ) injection 80 mg    [DISCONTINUED] triamcinolone  acetonide (KENALOG -40) injection 40 mg    No facility-administered encounter medications on file as of 03/28/2024.    Surgical History: Past Surgical History:  Procedure Laterality  Date   TUBAL LIGATION      Medical History: Past Medical History:  Diagnosis Date   Allergy    Chicken pox age 60   Diabetes mellitus without complication (HCC)    Hypertension     Family History: Family History  Problem  Relation Age of Onset   Cancer Mother    Diabetes Father    Heart attack Maternal Grandmother    Breast cancer Neg Hx     Social History   Socioeconomic History   Marital status: Single    Spouse name: Not on file   Number of children: Not on file   Years of education: Not on file   Highest education level: Not on file  Occupational History   Not on file  Tobacco Use   Smoking status: Some Days    Current packs/day: 0.00    Average packs/day: 0.5 packs/day    Types: Cigarettes, E-cigarettes    Last attempt to quit: 05/09/2021    Years since quitting: 2.9   Smokeless tobacco: Never  Vaping Use   Vaping status: Never Used  Substance and Sexual Activity   Alcohol use: Yes    Comment: 1-2 per month (wine or mixed)   Drug use: Never   Sexual activity: Yes    Partners: Male  Other Topics Concern   Not on file  Social History Narrative   Not on file   Social Drivers of Health   Tobacco Use: High Risk (04/19/2024)   Patient History    Smoking Tobacco Use: Some Days    Smokeless Tobacco Use: Never    Passive Exposure: Not on file  Financial Resource Strain: Not on file  Food Insecurity: Not on file  Transportation Needs: Not on file  Physical Activity: Not on file  Stress: Not on file  Social Connections: Not on file  Intimate Partner Violence: Not on file  Depression (PHQ2-9): Low Risk (04/19/2024)   Depression (PHQ2-9)    PHQ-2 Score: 0  Alcohol Screen: Low Risk (10/15/2021)   Alcohol Screen    Last Alcohol Screening Score (AUDIT): 2  Housing: Not on file  Utilities: Not on file  Health Literacy: Not on file      Review of Systems  Constitutional: Negative.  Negative for fever.  HENT:  Negative for congestion, mouth sores and postnasal drip.   Respiratory:  Negative for cough.   Cardiovascular:  Negative for chest pain.  Gastrointestinal: Negative.  Negative for diarrhea, nausea and vomiting.  Genitourinary: Negative.  Negative for flank pain.   Musculoskeletal:  Positive for arthralgias, back pain, gait problem and myalgias.  Psychiatric/Behavioral: Negative.      Vital Signs: BP 138/88   Pulse 88   Temp 97.6 F (36.4 C)   Resp 16   Ht 5' 3.5 (1.613 m)   Wt 190 lb 9.6 oz (86.5 kg)   LMP 10/17/2017   SpO2 95%   BMI 33.23 kg/m    Physical Exam Vitals reviewed.  Constitutional:      General: She is not in acute distress.    Appearance: Normal appearance. She is obese. She is not ill-appearing.  HENT:     Head: Normocephalic and atraumatic.  Eyes:     Pupils: Pupils are equal, round, and reactive to light.  Cardiovascular:     Rate and Rhythm: Normal rate and regular rhythm.     Heart sounds: Normal heart sounds.  Pulmonary:     Effort: Pulmonary effort is normal. No respiratory distress.  Breath sounds: Normal breath sounds. No wheezing.  Abdominal:     General: Bowel sounds are normal.     Palpations: Abdomen is soft.  Musculoskeletal:     Lumbar back: Spasms and tenderness present. Decreased range of motion.     Right lower leg: No edema.     Left lower leg: No edema.  Skin:    General: Skin is warm and dry.     Capillary Refill: Capillary refill takes less than 2 seconds.  Neurological:     Mental Status: She is alert and oriented to person, place, and time.  Psychiatric:        Mood and Affect: Mood normal.        Behavior: Behavior normal.        Assessment/Plan: 1. Chronic bilateral low back pain with bilateral sciatica (Primary) Continue OTC medication as needed and depo medrol  injection administered today to help with severe low back pain flare up.  - methylPREDNISolone  acetate (DEPO-MEDROL ) injection 80 mg  2. Encounter for physical examination of prospective foster parent Physical exam and paperwork completed for the patient at her request.  3. Screening for tuberculosis TB blood test ordered  - QuantiFERON-TB Gold Plus   General Counseling: Joeann verbalizes understanding of  the findings of todays visit and agrees with plan of treatment. I have discussed any further diagnostic evaluation that may be needed or ordered today. We also reviewed her medications today. she has been encouraged to call the office with any questions or concerns that should arise related to todays visit.    Orders Placed This Encounter  Procedures   QuantiFERON-TB Gold Plus    Meds ordered this encounter  Medications   methylPREDNISolone  acetate (DEPO-MEDROL ) injection 80 mg    Return for keep next regular scheduled appt .   Total time spent:30 Minutes Time spent includes review of chart, medications, test results, and follow up plan with the patient.   [place] Controlled Substance Database was reviewed by me.  This patient was seen by Mardy Maxin, FNP-C in collaboration with Dr. Sigrid Bathe as a part of collaborative care agreement.   Tomasita Beevers R. Maxin, MSN, FNP-C Internal medicine

## 2024-03-29 ENCOUNTER — Other Ambulatory Visit
Admission: RE | Admit: 2024-03-29 | Discharge: 2024-03-29 | Disposition: A | Discharge status: Home / Self Care | Payer: Self-pay | Source: Ambulatory Visit | Attending: Nurse Practitioner | Admitting: Nurse Practitioner

## 2024-03-29 DIAGNOSIS — Z111 Encounter for screening for respiratory tuberculosis: Secondary | ICD-10-CM | POA: Insufficient documentation

## 2024-04-01 LAB — QUANTIFERON-TB GOLD PLUS: QuantiFERON-TB Gold Plus: NEGATIVE

## 2024-04-01 LAB — QUANTIFERON-TB GOLD PLUS (RQFGPL)
QuantiFERON Mitogen Value: >10 [IU]/mL
QuantiFERON Nil Value: 0.04 [IU]/mL
QuantiFERON TB1 Ag Value: 0.04 [IU]/mL
QuantiFERON TB2 Ag Value: 0.05 [IU]/mL

## 2024-04-12 ENCOUNTER — Encounter: Payer: 59 | Admitting: Nurse Practitioner

## 2024-04-15 ENCOUNTER — Encounter: Payer: 59 | Admitting: Nurse Practitioner

## 2024-04-19 ENCOUNTER — Encounter: Payer: Self-pay | Admitting: Nurse Practitioner

## 2024-04-19 ENCOUNTER — Telehealth: Payer: Self-pay | Admitting: Nurse Practitioner

## 2024-04-19 ENCOUNTER — Ambulatory Visit: Payer: Self-pay | Admitting: Nurse Practitioner

## 2024-04-19 VITALS — BP 134/86 | HR 80 | Temp 96.3°F | Resp 16 | Ht 63.5 in | Wt 189.2 lb

## 2024-04-19 DIAGNOSIS — R300 Dysuria: Secondary | ICD-10-CM

## 2024-04-19 DIAGNOSIS — E538 Deficiency of other specified B group vitamins: Secondary | ICD-10-CM | POA: Insufficient documentation

## 2024-04-19 DIAGNOSIS — E1159 Type 2 diabetes mellitus with other circulatory complications: Secondary | ICD-10-CM

## 2024-04-19 DIAGNOSIS — E559 Vitamin D deficiency, unspecified: Secondary | ICD-10-CM | POA: Insufficient documentation

## 2024-04-19 DIAGNOSIS — E119 Type 2 diabetes mellitus without complications: Secondary | ICD-10-CM | POA: Insufficient documentation

## 2024-04-19 DIAGNOSIS — Z0001 Encounter for general adult medical examination with abnormal findings: Secondary | ICD-10-CM

## 2024-04-19 DIAGNOSIS — G8929 Other chronic pain: Secondary | ICD-10-CM

## 2024-04-19 DIAGNOSIS — I152 Hypertension secondary to endocrine disorders: Secondary | ICD-10-CM

## 2024-04-19 DIAGNOSIS — Z1231 Encounter for screening mammogram for malignant neoplasm of breast: Secondary | ICD-10-CM

## 2024-04-19 DIAGNOSIS — Z1211 Encounter for screening for malignant neoplasm of colon: Secondary | ICD-10-CM

## 2024-04-19 DIAGNOSIS — E1169 Type 2 diabetes mellitus with other specified complication: Secondary | ICD-10-CM | POA: Insufficient documentation

## 2024-04-19 MED ORDER — AMLODIPINE BESYLATE 5 MG PO TABS: 5.0000 mg | ORAL_TABLET | Freq: Every day | ORAL | 1 refills | Status: AC

## 2024-04-19 MED ORDER — IBUPROFEN 800 MG PO TABS: 800.0000 mg | ORAL_TABLET | Freq: Three times a day (TID) | ORAL | 0 refills | Status: DC | PRN

## 2024-04-19 MED ORDER — LOSARTAN POTASSIUM-HCTZ 50-12.5 MG PO TABS: 1.0000 | ORAL_TABLET | Freq: Every day | ORAL | 1 refills | Status: AC

## 2024-04-19 NOTE — Telephone Encounter (Signed)
 Received [employer] FMLA form from patient. Gave to Alyssa to complete-Toni

## 2024-04-19 NOTE — Progress Notes (Signed)
 [facility] [address] [place], [place] [address]  Internal MEDICINE  Office Visit Note  Patient Name: Stacy Curry  [id number]  [id number]  Date of Service: 04/19/2024  Chief Complaint  Patient presents with   Diabetes   Hypertension   Annual Exam    HPI Stacy Curry presents for an annual well visit and physical exam.  Well-appearing 53 y.o. female with type 2 diabetes, chronic low back and hip pain, hypertension and hyperlipidemia  Routine CRC screening: due for cologuard test Routine mammogram: done in February this year, due in 3 months  DEXA scan: not due yet Pap smear: due in 2027 Eye exam: needs to have diabetic eye exam  foot exam: done  Labs: due for routine labs  New or worsening pain: chronic back pain.  Low back pain, chronic  -- takes ibuprofen  as needed  Does not have insurance until January   Current Medication: Outpatient Encounter Medications as of 04/19/2024  Medication Sig   amLODipine  (NORVASC ) 5 MG tablet Take 1 tablet (5 mg total) by mouth daily.   ibuprofen  (ADVIL ) 800 MG tablet Take 1 tablet (800 mg total) by mouth every 8 (eight) hours as needed for moderate pain (pain score 4-6).   losartan -hydrochlorothiazide  (HYZAAR) 50-12.5 MG tablet Take 1 tablet by mouth daily.   methocarbamol  (ROBAXIN ) 500 MG tablet Take 1-2 tablets (500-1,000 mg total) by mouth every 6 (six) hours as needed for muscle spasms.   Vitamin D , Ergocalciferol , (DRISDOL ) 1.25 MG (50000 UNIT) CAPS capsule Take 1 capsule by mouth once a week   [DISCONTINUED] Accu-Chek Softclix Lancets lancets Use as instructed (Patient not taking: Reported on 04/19/2024)   [DISCONTINUED] amLODipine  (NORVASC ) 5 MG tablet Take 1 tablet (5 mg total) by mouth daily.   [DISCONTINUED] azithromycin  (ZITHROMAX ) 250 MG tablet Take 500 mg on day one then 250 mg per day for days 2 to 5 (Patient not taking: Reported on 03/28/2024)   [DISCONTINUED] chlorpheniramine-HYDROcodone  (TUSSIONEX) 10-8 MG/5ML Take  5 mLs by mouth every 12 (twelve) hours as needed for cough. (Patient not taking: Reported on 03/28/2024)   [DISCONTINUED] cyanocobalamin  (VITAMIN B12) 1000 MCG/ML injection Inject 1 mL (1,000 mcg total) into the muscle every 30 (thirty) days. (Patient not taking: Reported on 03/28/2024)   [DISCONTINUED] cyclobenzaprine  (FLEXERIL ) 10 MG tablet Take 0.5-1 tablets (5-10 mg total) by mouth at bedtime as needed (musculoskeletal pain). (Patient not taking: Reported on 04/19/2024)   [DISCONTINUED] doxycycline  (MONODOX ) 100 MG capsule Take 1 capsule (100 mg total) by mouth 2 (two) times daily. Take with food (Patient not taking: Reported on 03/28/2024)   [DISCONTINUED] glucose blood test strip Use as instructed (Patient not taking: Reported on 03/28/2024)   [DISCONTINUED] ibuprofen  (ADVIL ) 800 MG tablet Take 1 tablet (800 mg total) by mouth every 8 (eight) hours as needed for moderate pain (pain score 4-6).   [DISCONTINUED] losartan -hydrochlorothiazide  (HYZAAR) 50-12.5 MG tablet Take 1 tablet by mouth daily.   [DISCONTINUED] Multiple Vitamin (MULTIVITAMIN) tablet Take 1 tablet by mouth daily. (Patient not taking: Reported on 04/19/2024)   [DISCONTINUED] ondansetron  (ZOFRAN ) 4 MG tablet Take 1 tablet (4 mg total) by mouth every 8 (eight) hours as needed for nausea or vomiting. (Patient not taking: Reported on 03/28/2024)   [DISCONTINUED] SYRINGE-NEEDLE, DISP, 3 ML (B-D 3CC LUER-LOK SYR 21GX1) 21G X 1 3 ML MISC Use 1 syringe and needle to inject B12 every 30 days (Patient not taking: Reported on 03/28/2024)   [DISCONTINUED] triamcinolone  cream (KENALOG ) 0.1 % Apply bid to spots until flat.  Avoid applying to face, groin, and axilla. (Patient not taking: Reported on 04/19/2024)   [DISCONTINUED] triamcinolone  acetonide (KENALOG -40) injection 40 mg    No facility-administered encounter medications on file as of 04/19/2024.    Surgical History: Past Surgical History:  Procedure Laterality Date   TUBAL LIGATION       Medical History: Past Medical History:  Diagnosis Date   Allergy    Chicken pox age 4   Diabetes mellitus without complication (HCC)    Hypertension     Family History: Family History  Problem Relation Age of Onset   Cancer Mother    Diabetes Father    Heart attack Maternal Grandmother    Breast cancer Neg Hx     Social History   Socioeconomic History   Marital status: Single    Spouse name: Not on file   Number of children: Not on file   Years of education: Not on file   Highest education level: Not on file  Occupational History   Not on file  Tobacco Use   Smoking status: Some Days    Current packs/day: 0.00    Types: Cigarettes, E-cigarettes    Last attempt to quit: 05/09/2021    Years since quitting: 2.9   Smokeless tobacco: Never  Vaping Use   Vaping status: Never Used  Substance and Sexual Activity   Alcohol use: Yes    Comment: 1-2 per month (wine or mixed)   Drug use: Never   Sexual activity: Yes    Partners: Male  Other Topics Concern   Not on file  Social History Narrative   Not on file   Social Drivers of Health   Financial Resource Strain: Not on file  Food Insecurity: Not on file  Transportation Needs: Not on file  Physical Activity: Not on file  Stress: Not on file  Social Connections: Not on file  Intimate Partner Violence: Not on file      Review of Systems  Constitutional:  Negative for activity change, appetite change, chills, fatigue, fever and unexpected weight change.  HENT: Negative.  Negative for congestion, ear pain, rhinorrhea, sore throat and trouble swallowing.   Eyes: Negative.   Respiratory: Negative.  Negative for cough, chest tightness, shortness of breath and wheezing.   Cardiovascular: Negative.  Negative for chest pain and palpitations.  Gastrointestinal: Negative.  Negative for abdominal pain, blood in stool, constipation, diarrhea, nausea and vomiting.  Endocrine: Negative.   Genitourinary: Negative.   Negative for difficulty urinating, dysuria, frequency, hematuria and urgency.  Musculoskeletal:  Positive for arthralgias and back pain. Negative for joint swelling, myalgias and neck pain.  Skin: Negative.  Negative for rash and wound.  Allergic/Immunologic: Negative.  Negative for immunocompromised state.  Neurological: Negative.  Negative for dizziness, seizures, numbness and headaches.  Hematological: Negative.   Psychiatric/Behavioral: Negative.  Negative for behavioral problems, self-injury and suicidal ideas. The patient is not nervous/anxious.     Vital Signs: BP 134/86 Comment: 150/96  Pulse 80   Temp (!) 96.3 F (35.7 C)   Resp 16   Ht 5' 3.5 (1.613 m)   Wt 189 lb 3.2 oz (85.8 kg)   LMP 10/17/2017   SpO2 98%   BMI 32.99 kg/m    Physical Exam Vitals reviewed.  Constitutional:      General: She is awake. She is not in acute distress.    Appearance: Normal appearance. She is well-developed and well-groomed. She is not ill-appearing or diaphoretic.  HENT:  Head: Normocephalic and atraumatic.     Right Ear: Tympanic membrane, ear canal and external ear normal.     Left Ear: Tympanic membrane, ear canal and external ear normal.     Nose: Nose normal. No congestion or rhinorrhea.     Mouth/Throat:     Mouth: Mucous membranes are moist.     Pharynx: Oropharynx is clear. No oropharyngeal exudate or posterior oropharyngeal erythema.  Eyes:     General: No scleral icterus.       Right eye: No discharge.        Left eye: No discharge.     Extraocular Movements: Extraocular movements intact.     Conjunctiva/sclera: Conjunctivae normal.     Pupils: Pupils are equal, round, and reactive to light.  Neck:     Thyroid : No thyromegaly.     Vascular: No JVD.     Trachea: No tracheal deviation.  Cardiovascular:     Rate and Rhythm: Normal rate and regular rhythm.     Pulses: Normal pulses.     Heart sounds: Normal heart sounds. No murmur heard.    No friction rub. No  gallop.  Pulmonary:     Effort: Pulmonary effort is normal. No respiratory distress.     Breath sounds: Normal breath sounds. No stridor. No wheezing or rales.  Chest:     Chest wall: No tenderness.  Abdominal:     General: Bowel sounds are normal. There is no distension.     Palpations: Abdomen is soft. There is no mass.     Tenderness: There is no abdominal tenderness. There is no guarding or rebound.     Hernia: There is no hernia in the left inguinal area or right inguinal area.  Genitourinary:    General: Normal vulva.     Exam position: Lithotomy position.     Pubic Area: No rash or pubic lice.      Labia:        Right: No rash, tenderness, lesion or injury.        Left: No rash, tenderness, lesion or injury.      Urethra: No prolapse, urethral swelling or urethral lesion.     Vagina: Normal. No signs of injury and foreign body. No vaginal discharge, erythema, tenderness, bleeding, lesions or prolapsed vaginal walls.     Cervix: No cervical motion tenderness, discharge, friability, lesion, erythema, cervical bleeding or eversion.     Uterus: Normal. Not deviated, not enlarged, not fixed, not tender and no uterine prolapse.      Adnexa: Right adnexa normal and left adnexa normal.     Rectum: Normal. No tenderness, anal fissure or external hemorrhoid. Normal anal tone.  Musculoskeletal:        General: No tenderness or deformity. Normal range of motion.     Cervical back: Normal range of motion and neck supple.  Lymphadenopathy:     Cervical: No cervical adenopathy.     Lower Body: No right inguinal adenopathy. No left inguinal adenopathy.  Skin:    General: Skin is warm and dry.     Capillary Refill: Capillary refill takes less than 2 seconds.     Coloration: Skin is not pale.     Findings: No erythema or rash.  Neurological:     Mental Status: She is alert and oriented to person, place, and time.     Cranial Nerves: No cranial nerve deficit.     Motor: No abnormal muscle  tone.     Coordination:  Coordination normal.     Gait: Gait normal.     Deep Tendon Reflexes: Reflexes are normal and symmetric.  Psychiatric:        Mood and Affect: Mood normal.        Behavior: Behavior normal. Behavior is cooperative.        Thought Content: Thought content normal.        Judgment: Judgment normal.        Assessment/Plan: 1. Encounter for routine adult health examination with abnormal findings (Primary) Age-appropriate preventive screenings and vaccinations discussed, annual physical exam completed. Routine labs for health maintenance ordered, see below. PHM updated.   - CBC with Differential/Platelet - CMP14+EGFR - Lipid Profile - Hgb A1C w/o eAG - Vitamin D  (25 hydroxy) - B12 and Folate Panel - Iron, TIBC and Ferritin Panel - TSH + free T4  2. Type 2 diabetes mellitus with other circulatory complication, without long-term current use of insulin (HCC) Routine labs ordered Urine sent to lab for microalbumin/creatinine ratio.  - CBC with Differential/Platelet - CMP14+EGFR - Lipid Profile - Hgb A1C w/o eAG - TSH + free T4 - Urine microalbumin/creatinine ratio   3. Hypertension associated with type 2 diabetes mellitus (HCC) Routine labs ordered ordered. Continue amlodipine  and lisinopril-hydrochlorothiazide  as prescribed.  - CBC with Differential/Platelet - CMP14+EGFR - Lipid Profile - Hgb A1C w/o eAG - TSH + free T4 - losartan -hydrochlorothiazide  (HYZAAR) 50-12.5 MG tablet; Take 1 tablet by mouth daily.  Dispense: 90 tablet; Refill: 1 - amLODipine  (NORVASC ) 5 MG tablet; Take 1 tablet (5 mg total) by mouth daily.  Dispense: 90 tablet; Refill: 1  4. Hyperlipidemia associated with type 2 diabetes mellitus (HCC) Routine labs ordered  - CBC with Differential/Platelet - CMP14+EGFR - Lipid Profile - Hgb A1C w/o eAG - TSH + free T4  5. Chronic bilateral low back pain with bilateral sciatica Continue prn ibuprofen  and prn methocarbamol  as prescribed.   - ibuprofen  (ADVIL ) 800 MG tablet; Take 1 tablet (800 mg total) by mouth every 8 (eight) hours as needed for moderate pain (pain score 4-6).  Dispense: 90 tablet; Refill: 0  6. B12 deficiency Routine labs ordered  - CBC with Differential/Platelet - B12 and Folate Panel - Iron, TIBC and Ferritin Panel  7. Vitamin D  deficiency Routine labs ordered  - Vitamin D  (25 hydroxy) - TSH + free T4  8. Dysuria Urine sent to lab - UA/M w/rflx Culture, Routine  9. Screening for colorectal cancer Routine cologuard test ordered  - Cologuard  10. Encounter for screening mammogram for malignant neoplasm of breast Routine mammogram ordered for February 2026 - MM 3D SCREENING MAMMOGRAM BILATERAL BREAST; Future     General Counseling: Loyce verbalizes understanding of the findings of todays visit and agrees with plan of treatment. I have discussed any further diagnostic evaluation that may be needed or ordered today. We also reviewed her medications today. she has been encouraged to call the office with any questions or concerns that should arise related to todays visit.    Orders Placed This Encounter  Procedures   MM 3D SCREENING MAMMOGRAM BILATERAL BREAST   Cologuard   CBC with Differential/Platelet   CMP14+EGFR   Lipid Profile   Hgb A1C w/o eAG   Vitamin D  (25 hydroxy)   B12 and Folate Panel   Iron, TIBC and Ferritin Panel   TSH + free T4    Meds ordered this encounter  Medications   ibuprofen  (ADVIL ) 800 MG tablet    Sig: Take 1  tablet (800 mg total) by mouth every 8 (eight) hours as needed for moderate pain (pain score 4-6).    Dispense:  90 tablet    Refill:  0    Fill new script today   losartan -hydrochlorothiazide  (HYZAAR) 50-12.5 MG tablet    Sig: Take 1 tablet by mouth daily.    Dispense:  90 tablet    Refill:  1   amLODipine  (NORVASC ) 5 MG tablet    Sig: Take 1 tablet (5 mg total) by mouth daily.    Dispense:  90 tablet    Refill:  1    Return in about 7 weeks  (around 06/05/2024) for F/U, Labs, Anne-Marie Genson PCP.   Total time spent:30 Minutes Time spent includes review of chart, medications, test results, and follow up plan with the patient.   [place] Controlled Substance Database was reviewed by me.  This patient was seen by Mardy Maxin, FNP-C in collaboration with Dr. Sigrid Bathe as a part of collaborative care agreement.  Risha Barretta R. Maxin, MSN, FNP-C Internal medicine

## 2024-04-23 LAB — MICROSCOPIC EXAMINATION: Casts: NONE SEEN /LPF

## 2024-04-23 LAB — UA/M W/RFLX CULTURE, ROUTINE
Bilirubin, UA: NEGATIVE
Glucose, UA: NEGATIVE
Ketones, UA: NEGATIVE
Nitrite, UA: NEGATIVE
Protein,UA: NEGATIVE
RBC, UA: NEGATIVE
Specific Gravity, UA: 1.01 (ref 1.005–1.030)
Urobilinogen, Ur: 0.2 mg/dL (ref 0.2–1.0)
pH, UA: 6 (ref 5.0–7.5)

## 2024-04-23 LAB — URINE CULTURE, REFLEX

## 2024-04-23 LAB — MICROALBUMIN / CREATININE URINE RATIO
Creatinine, Urine: 46.1 mg/dL
Microalb/Creat Ratio: <7 mg/g{creat} (ref 0–29)
Microalbumin, Urine: <3 ug/mL

## 2024-04-24 ENCOUNTER — Telehealth: Payer: Self-pay | Admitting: Nurse Practitioner

## 2024-04-24 NOTE — Telephone Encounter (Signed)
 [employer] FMLA form completed & faxed back; [phone]. Scanned.Patient to p/u @ front desk-Toni

## 2024-05-07 ENCOUNTER — Encounter: Payer: Self-pay | Admitting: Nurse Practitioner

## 2024-05-13 ENCOUNTER — Other Ambulatory Visit: Payer: Self-pay | Admitting: Internal Medicine

## 2024-05-13 MED ORDER — OSELTAMIVIR PHOSPHATE 75 MG PO CAPS: 75.0000 mg | ORAL_CAPSULE | Freq: Every day | ORAL | 0 refills | Status: AC

## 2024-05-28 LAB — COLOGUARD: COLOGUARD: NEGATIVE

## 2024-05-29 ENCOUNTER — Ambulatory Visit: Payer: Self-pay | Admitting: Nurse Practitioner

## 2024-05-29 NOTE — Progress Notes (Signed)
 Please let the patient know that her cologuard test was negative. Will repeat cologuard in 3 years.

## 2024-05-29 NOTE — Telephone Encounter (Signed)
-----   Message from Mardy Maxin, NP sent at 05/29/2024  7:37 AM EST ----- Please let the patient know that her cologuard test was negative. Will repeat cologuard in 3 years.

## 2024-05-29 NOTE — Telephone Encounter (Signed)
 Patient notified.

## 2024-06-06 ENCOUNTER — Encounter: Payer: Self-pay | Admitting: Nurse Practitioner

## 2024-06-06 ENCOUNTER — Other Ambulatory Visit: Payer: Self-pay | Admitting: Nurse Practitioner

## 2024-06-06 DIAGNOSIS — G8929 Other chronic pain: Secondary | ICD-10-CM

## 2024-06-10 ENCOUNTER — Ambulatory Visit: Payer: Self-pay | Admitting: Nurse Practitioner

## 2024-06-25 ENCOUNTER — Ambulatory Visit: Payer: Self-pay | Admitting: Nurse Practitioner

## 2025-04-21 ENCOUNTER — Encounter: Payer: Self-pay | Admitting: Nurse Practitioner
